# Patient Record
Sex: Female | Born: 1990 | Race: White | Hispanic: No | Marital: Married | Smoking: Never smoker
Health system: Southern US, Community
[De-identification: ages and names within clinical notes are randomized; demographics above are authoritative.]

## PROBLEM LIST (undated history)

## (undated) DIAGNOSIS — Z789 Other specified health status: Secondary | ICD-10-CM

## (undated) HISTORY — PX: OTHER SURGICAL HISTORY: SHX169

---
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
## 2010-09-15 ENCOUNTER — Inpatient Hospital Stay: Payer: Self-pay

## 2013-03-24 ENCOUNTER — Inpatient Hospital Stay: Payer: Self-pay | Admitting: Obstetrics and Gynecology

## 2013-03-24 LAB — CBC WITH DIFFERENTIAL/PLATELET
Basophil #: 0.1 10*3/uL (ref 0.0–0.1)
Eosinophil #: 0.5 10*3/uL (ref 0.0–0.7)
Eosinophil %: 2.7 %
HCT: 39.4 % (ref 35.0–47.0)
HGB: 12.9 g/dL (ref 12.0–16.0)
MCHC: 32.8 g/dL (ref 32.0–36.0)
Monocyte %: 7.7 %
Neutrophil %: 75.6 %
RBC: 4.64 10*6/uL (ref 3.80–5.20)
RDW: 15.3 % — ABNORMAL HIGH (ref 11.5–14.5)
WBC: 18.3 10*3/uL — ABNORMAL HIGH (ref 3.6–11.0)

## 2013-03-25 LAB — HEMATOCRIT: HCT: 37.4 % (ref 35.0–47.0)

## 2015-05-02 NOTE — H&P (Signed)
L&D Evaluation:  History:  HPI 24 yo G2P1001 at 7756w5d gestational age consistent with 13 week ultrasound.  Her pregnancy was otherwise uncomplicated. She presents in advanced active labor with contactions starting this past evening.  She notes positive fetal movement, no leakage of fluid, and no vaginal bleeding.   Blood type O+, RI, VZI, HBsAg neg, RPR NR, GBS POS   Patient's Medical History No Chronic Illness   Patient's Surgical History wisdom teeth extraction   Medications Pre Natal Vitamins   Allergies PCN, itching reaction   Social History none   Family History Non-Contributory   ROS:  ROS All systems were reviewed.  HEENT, CNS, GI, GU, Respiratory, CV, Renal and Musculoskeletal systems were found to be normal., unless noted in HPI   Exam:  Vital Signs stable   General moderate distress with contractions   Mental Status clear   Chest clear   Heart normal sinus rhythm   Abdomen gravid, tender with contractions   Estimated Fetal Weight Average for gestational age   Fetal Position cephalic   Back no CVAT   Edema no edema   Pelvic no external lesions, 8cm per RN   Mebranes Intact   FHT intermittent early decelerations   FHT Description normal baseline with intermittent early decelerations   Ucx no traced, but appeared regular   Skin no lesions   Impression:  Impression active labor   Plan:  Plan monitor contractions and for cervical change, antibiotics for GBBS prophylaxis   Comments Admit for labor administer ancef for GBS + Given FHR decelerations decision made to AROM.  Clear fluid returned.  Very shortly afterward delivery occured.  See delivery note for details. she would have delivered prior to receiving antibiotics regardless and given FHR quick delivery was indicated.   Electronic Signatures: Conard NovakJackson, Stephen D (MD)  (Signed 02-Apr-14 01:35)  Authored: L&D Evaluation   Last Updated: 02-Apr-14 01:35 by Conard NovakJackson, Stephen D (MD)

## 2015-12-24 NOTE — L&D Delivery Note (Addendum)
Delivery Summary for Shannon Brady  Labor Events:   Preterm labor:   Rupture date:   Rupture time:   Rupture type: Bulging bag of water  Fluid Color:   Induction:   Augmentation:   Complications:   Cervical ripening:          Delivery:   Episiotomy:   Lacerations:   Repair suture:   Repair # of packets:   Blood loss (ml): 200   Information for the patient's newborn:  Shannon Brady, Shannon Brady [[id number]][[id number]]    Delivery 07/11/2016 5:45 PM by  Vaginal, Spontaneous Delivery Sex:  female Gestational Age: 9370w5d Delivery Clinician:  Hildred LaserAnika Delyla Sandeen Living?:         APGARS  One minute Five minutes Ten minutes  Skin color: 0   1      Heart rate: 2   2      Grimace: 2   2      Muscle tone: 2   2      Breathing: 2   2      Totals: 8  9      Presentation/position: Vertex     Resuscitation:   Cord information:    Disposition of cord blood: No    Blood gases sent? No Complications: None  Placenta: Delivered: 07/11/2016 6:13 PM  Spontaneous  Intact appearance Newborn Measurements: Weight: 4 lb 12.5 oz (2170 g)  Height: 17.91"  Head circumference:    Chest circumference:    Other providers: Registered Nurse Registered Nurse Jeanne IvanMargaret A Millner Tiffany D [place]  Additional  information: Forceps:   Vacuum:   Breech:   Observed anomalies      Information for the patient's newborn:  Shannon Brady, Shannon Brady [[id number]][[id number]]    Delivery 07/11/2016 6:01 PM by  Vaginal, Spontaneous Delivery Sex:  female Gestational Age: 5570w5d Delivery Clinician:  Hildred LaserAnika Sheng Pritz Living?:         APGARS  One minute Five minutes Ten minutes  Skin color: 0   1      Heart rate: 2   2      Grimace: 2   2      Muscle tone: 1   2      Breathing: 2   2      Totals: 7  9      Presentation/position: Homero FellersFrank Breech     Resuscitation:   Cord information: 3 vessels   Disposition of cord blood:     Blood gases sent?  Complications:   Placenta: Delivered: 07/11/2016 6:13 PM  Spontaneous  Intact appearance Newborn  Measurements: Weight: 4 lb 14 oz (2210 g)  Height: 18.31"  Head circumference:    Chest circumference:    Other providers: Registered Nurse Registered Nurse Jeanne IvanMargaret A Millner Tiffany D [place]  Additional  information: Forceps:   Vacuum:   Breech:   Observed anomalies         Delivery Note   Shannon Brady, Shannon Brady [[id number]][[id number]]  At 5:45 PM a viable and healthy female was delivered via  (Presentation: vertex; Left Occiput Anterior).  APGAR: 8, 9; weight 4 lb 12.5 oz (2170 g).   Placenta status: spontaneous, intact (removed after delivery of second twin).  Cord:  with the following complications: none     Shannon Brady, Shannon Brady [[id number]][[id number]]  At 6:01 PM a viable and healthy female was delivered via  (Presentation: frank breech presentation).  APGAR: 7, 9; weight 4 lb 14 oz (2210 g).   Placenta status: spontaneous, intact (removed after  delivery of second twin).  Cord: 3 vessels with the following complications: None.  Anesthesia:  None Episiotomy:  None Lacerations:  None Suture Repair: None Est. Blood Loss (mL):  200   Mom to postpartum.   Baby A to NICU.   Baby B to NICU.  Hildred Laser 07/11/2016, 7:10 PM

## 2016-01-19 LAB — OB RESULTS CONSOLE ABO/RH: RH Type: POSITIVE

## 2016-01-19 LAB — OB RESULTS CONSOLE ANTIBODY SCREEN: Antibody Screen: NEGATIVE

## 2016-01-19 LAB — OB RESULTS CONSOLE RPR: RPR: NONREACTIVE

## 2016-01-19 LAB — HM PAP SMEAR: HM Pap smear: NEGATIVE

## 2016-01-19 LAB — OB RESULTS CONSOLE GC/CHLAMYDIA
CHLAMYDIA, DNA PROBE: NEGATIVE
GC PROBE AMP, GENITAL: NEGATIVE

## 2016-01-19 LAB — OB RESULTS CONSOLE HEPATITIS B SURFACE ANTIGEN: Hepatitis B Surface Ag: NEGATIVE

## 2016-01-19 LAB — OB RESULTS CONSOLE VARICELLA ZOSTER ANTIBODY, IGG: Varicella: IMMUNE

## 2016-01-19 LAB — OB RESULTS CONSOLE HIV ANTIBODY (ROUTINE TESTING): HIV: NONREACTIVE

## 2016-01-19 LAB — OB RESULTS CONSOLE HGB/HCT, BLOOD
HEMATOCRIT: 40 %
Hemoglobin: 13 g/dL

## 2016-01-19 LAB — OB RESULTS CONSOLE PLATELET COUNT: PLATELETS: 305 10*3/uL

## 2016-01-19 LAB — OB RESULTS CONSOLE RUBELLA ANTIBODY, IGM: RUBELLA: IMMUNE

## 2016-01-31 ENCOUNTER — Other Ambulatory Visit: Payer: Self-pay | Admitting: Advanced Practice Midwife

## 2016-01-31 DIAGNOSIS — IMO0001 Reserved for inherently not codable concepts without codable children: Secondary | ICD-10-CM

## 2016-03-18 ENCOUNTER — Other Ambulatory Visit: Payer: Self-pay

## 2016-03-18 ENCOUNTER — Ambulatory Visit
Admission: RE | Admit: 2016-03-18 | Discharge: 2016-03-18 | Disposition: A | Discharge status: Home / Self Care | Payer: Medicaid Other | Source: Ambulatory Visit | Attending: Obstetrics & Gynecology | Admitting: Obstetrics & Gynecology

## 2016-03-18 VITALS — BP 125/70 | HR 89 | Temp 98.4°F | Resp 18 | Ht 61.0 in | Wt 148.0 lb

## 2016-03-18 DIAGNOSIS — Z3A18 18 weeks gestation of pregnancy: Secondary | ICD-10-CM | POA: Insufficient documentation

## 2016-03-18 DIAGNOSIS — O30032 Twin pregnancy, monochorionic/diamniotic, second trimester: Secondary | ICD-10-CM | POA: Insufficient documentation

## 2016-03-18 DIAGNOSIS — O30042 Twin pregnancy, dichorionic/diamniotic, second trimester: Secondary | ICD-10-CM

## 2016-03-18 DIAGNOSIS — O30039 Twin pregnancy, monochorionic/diamniotic, unspecified trimester: Secondary | ICD-10-CM | POA: Insufficient documentation

## 2016-03-18 DIAGNOSIS — Z36 Encounter for antenatal screening of mother: Secondary | ICD-10-CM | POA: Insufficient documentation

## 2016-03-18 DIAGNOSIS — O30002 Twin pregnancy, unspecified number of placenta and unspecified number of amniotic sacs, second trimester: Secondary | ICD-10-CM | POA: Insufficient documentation

## 2016-03-18 DIAGNOSIS — IMO0001 Reserved for inherently not codable concepts without codable children: Secondary | ICD-10-CM

## 2016-03-18 HISTORY — DX: Other specified health status: Z78.9

## 2016-04-01 ENCOUNTER — Ambulatory Visit
Admission: RE | Admit: 2016-04-01 | Discharge: 2016-04-01 | Disposition: A | Discharge status: Home / Self Care | Payer: Medicaid Other | Source: Ambulatory Visit | Attending: Maternal & Fetal Medicine | Admitting: Maternal & Fetal Medicine

## 2016-04-01 VITALS — BP 111/68 | HR 87 | Temp 98.2°F | Resp 18 | Ht 61.0 in | Wt 153.0 lb

## 2016-04-01 DIAGNOSIS — O30032 Twin pregnancy, monochorionic/diamniotic, second trimester: Secondary | ICD-10-CM

## 2016-04-01 DIAGNOSIS — Z3A20 20 weeks gestation of pregnancy: Secondary | ICD-10-CM | POA: Diagnosis not present

## 2016-04-01 DIAGNOSIS — O30002 Twin pregnancy, unspecified number of placenta and unspecified number of amniotic sacs, second trimester: Secondary | ICD-10-CM | POA: Diagnosis present

## 2016-04-15 ENCOUNTER — Ambulatory Visit
Admission: RE | Admit: 2016-04-15 | Discharge: 2016-04-15 | Disposition: A | Discharge status: Home / Self Care | Payer: Medicaid Other | Source: Ambulatory Visit | Attending: Obstetrics and Gynecology | Admitting: Obstetrics and Gynecology

## 2016-04-15 ENCOUNTER — Other Ambulatory Visit: Payer: Self-pay

## 2016-04-15 VITALS — BP 106/64 | HR 82 | Temp 98.0°F | Resp 16 | Ht 61.0 in | Wt 158.0 lb

## 2016-04-15 DIAGNOSIS — Z36 Encounter for antenatal screening of mother: Secondary | ICD-10-CM | POA: Insufficient documentation

## 2016-04-15 DIAGNOSIS — O30032 Twin pregnancy, monochorionic/diamniotic, second trimester: Secondary | ICD-10-CM | POA: Diagnosis present

## 2016-04-15 DIAGNOSIS — O30002 Twin pregnancy, unspecified number of placenta and unspecified number of amniotic sacs, second trimester: Secondary | ICD-10-CM

## 2016-04-15 DIAGNOSIS — IMO0001 Reserved for inherently not codable concepts without codable children: Secondary | ICD-10-CM

## 2016-04-15 DIAGNOSIS — Z3A22 22 weeks gestation of pregnancy: Secondary | ICD-10-CM | POA: Diagnosis not present

## 2016-04-15 DIAGNOSIS — O358XX2 Maternal care for other (suspected) fetal abnormality and damage, fetus 2: Secondary | ICD-10-CM

## 2016-04-15 DIAGNOSIS — IMO0002 Reserved for concepts with insufficient information to code with codable children: Secondary | ICD-10-CM | POA: Insufficient documentation

## 2016-04-17 ENCOUNTER — Ambulatory Visit (INDEPENDENT_AMBULATORY_CARE_PROVIDER_SITE_OTHER): Payer: Medicaid Other | Admitting: Obstetrics and Gynecology

## 2016-04-17 VITALS — BP 103/69 | HR 99 | Wt 157.3 lb

## 2016-04-17 DIAGNOSIS — IMO0001 Reserved for inherently not codable concepts without codable children: Secondary | ICD-10-CM

## 2016-04-17 DIAGNOSIS — Z3492 Encounter for supervision of normal pregnancy, unspecified, second trimester: Secondary | ICD-10-CM

## 2016-04-17 DIAGNOSIS — O358XX2 Maternal care for other (suspected) fetal abnormality and damage, fetus 2: Secondary | ICD-10-CM

## 2016-04-17 DIAGNOSIS — R8271 Bacteriuria: Secondary | ICD-10-CM

## 2016-04-17 DIAGNOSIS — O219 Vomiting of pregnancy, unspecified: Secondary | ICD-10-CM

## 2016-04-17 DIAGNOSIS — O30032 Twin pregnancy, monochorionic/diamniotic, second trimester: Secondary | ICD-10-CM

## 2016-04-17 LAB — POCT URINALYSIS DIPSTICK
BILIRUBIN UA: NEGATIVE
GLUCOSE UA: NEGATIVE
KETONES UA: NEGATIVE
Leukocytes, UA: NEGATIVE
Nitrite, UA: NEGATIVE
Protein, UA: NEGATIVE
RBC UA: NEGATIVE
SPEC GRAV UA: 1.02
Urobilinogen, UA: 0.2
pH, UA: 6

## 2016-04-17 NOTE — Progress Notes (Signed)
NOB transfer from [facility] mono di twins- [facility] u/s q 2 weeks.  NEW OB transfer note: 25 year old white female gravida 3 para 2002, EDD 08/17/2016, EGA 22.4 weeks with mono/twins, [facility].  Past OB history: G1-SVD 6 pounds last female G2-SVD 6 lbs. 8 oz. female; rapid delivery with an adequate time to get GBS prophylaxis on board.  Prenatal risk factors: 1. Mono/diet twins; currently getting every 2 week ultrasounds [facility], most recent ultrasound notable for concordance, vertex/non vertex presentation, and appropriate growth. 2. GBS bacteriuria 3. Nausea and vomiting of pregnancy 4. Penicillin allergy  Prenatal labs: O+/antibody screen negative/RPR nonreactive/rubella immune/Varicella immune/HB negative/HIV negative/GC negative/CT negative/platelets 305,000/urine culture-GBS positive  Past Medical History  Diagnosis Date  . Medical history non-contributory    Past Surgical History  Procedure Laterality Date  . No past surgeries     Family history: Positive for breast cancer, colon cancer lymphoma, malignant melanoma, type 2 diabetes mellitus Genetic history screening negative [facility]) Patient declines genetic screening.  Social history: Nonsmoker Alcohol use denied Drug use -denied.  OBJECTIVE: BP 103/69 mmHg  Pulse 99  Wt 157 lb 4.8 oz (71.351 kg)  LMP 11/11/2015  Pleasant gravid female in no acute distress Abdomen: Fundal height 29 cm; fetal heart rate 155/142 Pelvic: Deferred Extremities: No edema  ASSESSMENT: 1. 22.4 week mono/di twin pregnancy; vertex/non-vertex, concordance on 21 week ultrasound 2. Nausea and vomiting in  pregnancy; controlled with Diclegis 3. GBS bacteriuria; penicillin allergy  PLAN: 1. Prenatal vitamins with iron daily; folic acid 1 mg daily 2. Diclegis when necessary 3. Continue with every two-week ultrasounds at [facility]; monthly  growth scans; anticipate vaginal delivery provided  presenting twin is vertex 4. Risks of preterm labor and preeclampsia in twin gestation reviewed 5. GBS prophylaxis in labor  Herold HarmsMartin A Defrancesco, MD  Note: This dictation was prepared with Dragon dictation along with smaller phrase technology. Any transcriptional errors that result from this process are unintentional.

## 2016-04-18 DIAGNOSIS — O219 Vomiting of pregnancy, unspecified: Secondary | ICD-10-CM | POA: Insufficient documentation

## 2016-04-18 DIAGNOSIS — R8271 Bacteriuria: Secondary | ICD-10-CM | POA: Insufficient documentation

## 2016-04-22 ENCOUNTER — Encounter: Payer: Self-pay | Admitting: Obstetrics and Gynecology

## 2016-04-29 ENCOUNTER — Other Ambulatory Visit: Payer: Self-pay | Admitting: Obstetrics and Gynecology

## 2016-04-29 ENCOUNTER — Ambulatory Visit
Admission: RE | Admit: 2016-04-29 | Discharge: 2016-04-29 | Disposition: A | Discharge status: Home / Self Care | Payer: Medicaid Other | Source: Ambulatory Visit | Attending: Obstetrics & Gynecology | Admitting: Obstetrics & Gynecology

## 2016-04-29 VITALS — BP 116/71 | HR 77 | Temp 98.0°F | Wt 159.0 lb

## 2016-04-29 DIAGNOSIS — O358XX2 Maternal care for other (suspected) fetal abnormality and damage, fetus 2: Secondary | ICD-10-CM

## 2016-04-29 DIAGNOSIS — O30032 Twin pregnancy, monochorionic/diamniotic, second trimester: Secondary | ICD-10-CM | POA: Insufficient documentation

## 2016-04-29 DIAGNOSIS — IMO0001 Reserved for inherently not codable concepts without codable children: Secondary | ICD-10-CM

## 2016-04-29 DIAGNOSIS — Z36 Encounter for antenatal screening of mother: Secondary | ICD-10-CM | POA: Insufficient documentation

## 2016-04-29 DIAGNOSIS — Z3A24 24 weeks gestation of pregnancy: Secondary | ICD-10-CM | POA: Diagnosis not present

## 2016-04-29 DIAGNOSIS — O30002 Twin pregnancy, unspecified number of placenta and unspecified number of amniotic sacs, second trimester: Secondary | ICD-10-CM

## 2016-05-01 ENCOUNTER — Encounter: Payer: Self-pay | Admitting: Obstetrics and Gynecology

## 2016-05-01 ENCOUNTER — Ambulatory Visit (INDEPENDENT_AMBULATORY_CARE_PROVIDER_SITE_OTHER): Payer: Medicaid Other | Admitting: Obstetrics and Gynecology

## 2016-05-01 VITALS — BP 106/63 | HR 72 | Wt 161.1 lb

## 2016-05-01 DIAGNOSIS — IMO0001 Reserved for inherently not codable concepts without codable children: Secondary | ICD-10-CM

## 2016-05-01 DIAGNOSIS — O30032 Twin pregnancy, monochorionic/diamniotic, second trimester: Secondary | ICD-10-CM

## 2016-05-01 DIAGNOSIS — R8271 Bacteriuria: Secondary | ICD-10-CM

## 2016-05-01 DIAGNOSIS — O358XX2 Maternal care for other (suspected) fetal abnormality and damage, fetus 2: Secondary | ICD-10-CM

## 2016-05-01 LAB — POCT URINALYSIS DIPSTICK
Bilirubin, UA: NEGATIVE
Blood, UA: NEGATIVE
GLUCOSE UA: NEGATIVE
Ketones, UA: NEGATIVE
LEUKOCYTES UA: NEGATIVE
Nitrite, UA: NEGATIVE
Protein, UA: NEGATIVE
UROBILINOGEN UA: NEGATIVE
pH, UA: 7.5

## 2016-05-01 NOTE — Progress Notes (Signed)
ROB: Patient doing well, notes Deberah PeltonBraxton Hicks.  Continue q 2 week scans with [facility].  Discussed timing of delivery (between 36-37 weeks), can have vaginal delivery (with set up in OR) as long as twin A vertex.  Discussed need for GBS prophylaxis, antenatal steroids if indicated. Notes nausea controlled with Diclegis (takes 1 tab nightly). RTC in 4 weeks. For 28 week labs at that time.

## 2016-05-07 ENCOUNTER — Encounter: Payer: Medicaid Other | Admitting: Obstetrics and Gynecology

## 2016-05-13 ENCOUNTER — Other Ambulatory Visit: Payer: Self-pay | Admitting: Maternal and Fetal Medicine

## 2016-05-13 ENCOUNTER — Other Ambulatory Visit: Payer: Self-pay

## 2016-05-13 ENCOUNTER — Ambulatory Visit
Admission: RE | Admit: 2016-05-13 | Discharge: 2016-05-13 | Disposition: A | Discharge status: Home / Self Care | Payer: Medicaid Other | Source: Ambulatory Visit | Attending: Maternal and Fetal Medicine | Admitting: Maternal and Fetal Medicine

## 2016-05-13 DIAGNOSIS — O30049 Twin pregnancy, dichorionic/diamniotic, unspecified trimester: Secondary | ICD-10-CM

## 2016-05-13 DIAGNOSIS — O30002 Twin pregnancy, unspecified number of placenta and unspecified number of amniotic sacs, second trimester: Secondary | ICD-10-CM | POA: Diagnosis not present

## 2016-05-13 DIAGNOSIS — O30032 Twin pregnancy, monochorionic/diamniotic, second trimester: Secondary | ICD-10-CM

## 2016-05-13 DIAGNOSIS — Z3A26 26 weeks gestation of pregnancy: Secondary | ICD-10-CM | POA: Diagnosis not present

## 2016-05-13 MED ORDER — FOLIC ACID 1 MG PO TABS: 1.0000 mg | ORAL_TABLET | Freq: Every day | ORAL | Status: DC

## 2016-05-16 ENCOUNTER — Encounter: Payer: Self-pay | Admitting: Certified Nurse Midwife

## 2016-05-16 ENCOUNTER — Encounter: Payer: Self-pay | Admitting: Obstetrics & Gynecology

## 2016-05-16 ENCOUNTER — Encounter: Payer: Self-pay | Admitting: Advanced Practice Midwife

## 2016-05-16 ENCOUNTER — Encounter: Payer: Self-pay | Admitting: Obstetrics and Gynecology

## 2016-05-23 ENCOUNTER — Other Ambulatory Visit: Payer: Self-pay

## 2016-05-23 DIAGNOSIS — O30033 Twin pregnancy, monochorionic/diamniotic, third trimester: Secondary | ICD-10-CM

## 2016-05-27 ENCOUNTER — Ambulatory Visit
Admission: RE | Admit: 2016-05-27 | Discharge: 2016-05-27 | Disposition: A | Discharge status: Home / Self Care | Payer: Medicaid Other | Source: Ambulatory Visit | Attending: Obstetrics and Gynecology | Admitting: Obstetrics and Gynecology

## 2016-05-27 VITALS — BP 119/78 | HR 83 | Temp 98.0°F | Wt 167.0 lb

## 2016-05-27 DIAGNOSIS — O30033 Twin pregnancy, monochorionic/diamniotic, third trimester: Secondary | ICD-10-CM | POA: Diagnosis not present

## 2016-05-27 DIAGNOSIS — O358XX1 Maternal care for other (suspected) fetal abnormality and damage, fetus 1: Secondary | ICD-10-CM

## 2016-05-27 DIAGNOSIS — Z3A28 28 weeks gestation of pregnancy: Secondary | ICD-10-CM | POA: Insufficient documentation

## 2016-05-27 DIAGNOSIS — IMO0001 Reserved for inherently not codable concepts without codable children: Secondary | ICD-10-CM

## 2016-05-27 DIAGNOSIS — O43023 Fetus-to-fetus placental transfusion syndrome, third trimester: Secondary | ICD-10-CM | POA: Diagnosis present

## 2016-05-27 DIAGNOSIS — Z36 Encounter for antenatal screening of mother: Secondary | ICD-10-CM | POA: Diagnosis present

## 2016-05-29 ENCOUNTER — Ambulatory Visit (INDEPENDENT_AMBULATORY_CARE_PROVIDER_SITE_OTHER): Payer: Medicaid Other | Admitting: Obstetrics and Gynecology

## 2016-05-29 ENCOUNTER — Other Ambulatory Visit: Payer: Medicaid Other

## 2016-05-29 ENCOUNTER — Encounter: Payer: Self-pay | Admitting: Obstetrics and Gynecology

## 2016-05-29 VITALS — BP 101/64 | HR 80 | Wt 170.3 lb

## 2016-05-29 DIAGNOSIS — Z23 Encounter for immunization: Secondary | ICD-10-CM | POA: Diagnosis not present

## 2016-05-29 DIAGNOSIS — O30033 Twin pregnancy, monochorionic/diamniotic, third trimester: Secondary | ICD-10-CM | POA: Diagnosis not present

## 2016-05-29 DIAGNOSIS — O0990 Supervision of high risk pregnancy, unspecified, unspecified trimester: Secondary | ICD-10-CM | POA: Insufficient documentation

## 2016-05-29 DIAGNOSIS — O0993 Supervision of high risk pregnancy, unspecified, third trimester: Secondary | ICD-10-CM

## 2016-05-29 DIAGNOSIS — O30032 Twin pregnancy, monochorionic/diamniotic, second trimester: Secondary | ICD-10-CM

## 2016-05-29 LAB — POCT URINALYSIS DIPSTICK
BILIRUBIN UA: NEGATIVE
Blood, UA: NEGATIVE
GLUCOSE UA: NEGATIVE
Ketones, UA: NEGATIVE
Leukocytes, UA: NEGATIVE
NITRITE UA: NEGATIVE
Protein, UA: NEGATIVE
UROBILINOGEN UA: NEGATIVE
pH, UA: 7.5

## 2016-05-29 MED ORDER — TETANUS-DIPHTH-ACELL PERTUSSIS 5-2.5-18.5 LF-MCG/0.5 IM SUSP
0.5000 mL | Freq: Once | INTRAMUSCULAR | Status: AC
  Administered 2016-05-29: 0.5 mL via INTRAMUSCULAR

## 2016-05-29 NOTE — Progress Notes (Signed)
ROB: Patient denies complaints. Still noting Shannon PeltonBraxton Hicks.  Continue q 2 week scans with [facility] for mono-di twins. Last scan 05/27/16, normal growth, no TTTS, Baby B now Breech.  For 28 week labs today, Tdap given, discussed cord blood banking, blood consent signed.  Desires to breastfeed.  Desires BTL.  Other reversible forms of contraception were discussed with patient; she declines all other modalities. Risks of procedure discussed with patient including but not limited to: risk of regret, permanence of method, bleeding, infection, injury to surrounding organs and need for additional procedures.  Failure risk of 1-2 % with increased risk of ectopic gestation if pregnancy occurs was also discussed with patient.  Patient verbalized understanding of these risks and wants to proceed with sterilization.  Medicaid forms signed.  RTC in 2 weeks.

## 2016-05-30 LAB — CBC
Hematocrit: 39.7 % (ref 34.0–46.6)
Hemoglobin: 13.2 g/dL (ref 11.1–15.9)
MCH: 29.9 pg (ref 26.6–33.0)
MCHC: 33.2 g/dL (ref 31.5–35.7)
MCV: 90 fL (ref 79–97)
PLATELETS: 255 10*3/uL (ref 150–379)
RBC: 4.41 x10E6/uL (ref 3.77–5.28)
RDW: 13.5 % (ref 12.3–15.4)
WBC: 11.8 10*3/uL — AB (ref 3.4–10.8)

## 2016-05-30 LAB — GLUCOSE, 1 HOUR GESTATIONAL: GESTATIONAL DIABETES SCREEN: 93 mg/dL (ref 65–139)

## 2016-06-10 ENCOUNTER — Ambulatory Visit: Payer: Medicaid Other

## 2016-06-13 ENCOUNTER — Ambulatory Visit (INDEPENDENT_AMBULATORY_CARE_PROVIDER_SITE_OTHER): Payer: Medicaid Other

## 2016-06-13 ENCOUNTER — Ambulatory Visit (INDEPENDENT_AMBULATORY_CARE_PROVIDER_SITE_OTHER): Payer: Medicaid Other | Admitting: Obstetrics and Gynecology

## 2016-06-13 VITALS — BP 115/81 | HR 71 | Wt 174.0 lb

## 2016-06-13 DIAGNOSIS — O30033 Twin pregnancy, monochorionic/diamniotic, third trimester: Secondary | ICD-10-CM

## 2016-06-13 DIAGNOSIS — Z3493 Encounter for supervision of normal pregnancy, unspecified, third trimester: Secondary | ICD-10-CM

## 2016-06-13 DIAGNOSIS — O0993 Supervision of high risk pregnancy, unspecified, third trimester: Secondary | ICD-10-CM

## 2016-06-13 LAB — POCT URINALYSIS DIPSTICK
Bilirubin, UA: NEGATIVE
GLUCOSE UA: NEGATIVE
KETONES UA: NEGATIVE
Leukocytes, UA: NEGATIVE
Nitrite, UA: NEGATIVE
PROTEIN UA: NEGATIVE
RBC UA: NEGATIVE
SPEC GRAV UA: 1.01
UROBILINOGEN UA: NEGATIVE
pH, UA: 7

## 2016-06-13 NOTE — Progress Notes (Signed)
ROB:  Pt states increased swelling while up and out in the heat.  No other complaints at this time.  Continues to note braxton hicks. Denies vaginal or bloody discharge, HA, and visual changes.

## 2016-06-13 NOTE — Progress Notes (Signed)
ROB: Doing well, but complains of occasional feet swelling after standing for long periods of time. Advised  Growth scan performed today, 1.2% growth discordance, no TTTS, Baby A remains vertex. RTC in 2 weeks, continue q 2 week growth scans. Patient desires to continue growth scans here instead of [facility] for convenience.  Will schedule. Will also need to begin weekly NSTs at 32 weeks.

## 2016-06-27 ENCOUNTER — Encounter: Payer: Self-pay | Admitting: Obstetrics and Gynecology

## 2016-06-27 ENCOUNTER — Ambulatory Visit (INDEPENDENT_AMBULATORY_CARE_PROVIDER_SITE_OTHER): Payer: Medicaid Other

## 2016-06-27 ENCOUNTER — Ambulatory Visit (INDEPENDENT_AMBULATORY_CARE_PROVIDER_SITE_OTHER): Payer: Medicaid Other | Admitting: Obstetrics and Gynecology

## 2016-06-27 VITALS — BP 130/90 | HR 98 | Wt 173.4 lb

## 2016-06-27 DIAGNOSIS — O30033 Twin pregnancy, monochorionic/diamniotic, third trimester: Secondary | ICD-10-CM | POA: Diagnosis not present

## 2016-06-27 DIAGNOSIS — Z369 Encounter for antenatal screening, unspecified: Secondary | ICD-10-CM

## 2016-06-27 DIAGNOSIS — Z36 Encounter for antenatal screening of mother: Secondary | ICD-10-CM

## 2016-06-27 DIAGNOSIS — Z1389 Encounter for screening for other disorder: Secondary | ICD-10-CM

## 2016-06-27 DIAGNOSIS — O0993 Supervision of high risk pregnancy, unspecified, third trimester: Secondary | ICD-10-CM

## 2016-06-27 DIAGNOSIS — Z3493 Encounter for supervision of normal pregnancy, unspecified, third trimester: Secondary | ICD-10-CM

## 2016-06-27 LAB — POCT URINALYSIS DIPSTICK
Bilirubin, UA: NEGATIVE
Blood, UA: NEGATIVE
GLUCOSE UA: NEGATIVE
Ketones, UA: NEGATIVE
LEUKOCYTES UA: NEGATIVE
NITRITE UA: NEGATIVE
Protein, UA: NEGATIVE
Spec Grav, UA: 1.01
UROBILINOGEN UA: NEGATIVE
pH, UA: 6

## 2016-06-27 NOTE — Progress Notes (Signed)
B/P 130/90 x2 with manual cuff.

## 2016-06-30 NOTE — Progress Notes (Signed)
ROB: Patient denies complaints.  S/p normal growth scan, x 2 no evidence of TTTS.  Continue q 2 week scans.  To plan next visit for scheduled IOL at 36-37 weeks due to mono-di twins. To perform 36 week labs next visit. BPs slighlty elevated for patient, but still wnl, no evidence of edema or proteinuria.  Will continue to monitor.  To begin weekly NSTs. RTC in 2 weeks.

## 2016-07-04 ENCOUNTER — Ambulatory Visit (INDEPENDENT_AMBULATORY_CARE_PROVIDER_SITE_OTHER): Payer: Medicaid Other | Admitting: Obstetrics and Gynecology

## 2016-07-04 VITALS — BP 124/80 | HR 75 | Wt 179.4 lb

## 2016-07-04 DIAGNOSIS — Z36 Encounter for antenatal screening of mother: Secondary | ICD-10-CM

## 2016-07-04 DIAGNOSIS — O30033 Twin pregnancy, monochorionic/diamniotic, third trimester: Secondary | ICD-10-CM

## 2016-07-04 DIAGNOSIS — Z369 Encounter for antenatal screening, unspecified: Secondary | ICD-10-CM

## 2016-07-04 DIAGNOSIS — IMO0001 Reserved for inherently not codable concepts without codable children: Secondary | ICD-10-CM

## 2016-07-04 DIAGNOSIS — Z1389 Encounter for screening for other disorder: Secondary | ICD-10-CM

## 2016-07-04 DIAGNOSIS — O358XX1 Maternal care for other (suspected) fetal abnormality and damage, fetus 1: Secondary | ICD-10-CM

## 2016-07-04 LAB — POCT URINALYSIS DIPSTICK
Bilirubin, UA: NEGATIVE
Blood, UA: NEGATIVE
GLUCOSE UA: NEGATIVE
KETONES UA: NEGATIVE
LEUKOCYTES UA: NEGATIVE
Nitrite, UA: NEGATIVE
PROTEIN UA: NEGATIVE
SPEC GRAV UA: 1.01
UROBILINOGEN UA: NEGATIVE
pH, UA: 7.5

## 2016-07-04 NOTE — Progress Notes (Signed)
NONSTRESS TEST INTERPRETATION  INDICATIONS:  TTTS; slightly elevated B/P  FHR baseline: Baby A: 130    Baby B:120-130 RESULTS: reactive/reactive COMMENTS: pt states she does not feel any contractions, although she states she does have braxton hicks at times.   PLAN: 1. Continue fetal kick counts twice a day. 2. Continue antepartum testing as scheduled-weekly   Fenton Mallingebbie Aalijah Mims, LPN

## 2016-07-06 ENCOUNTER — Observation Stay
Admission: RE | Admit: 2016-07-06 | Discharge: 2016-07-06 | Disposition: A | Discharge status: Home / Self Care | Payer: Medicaid Other | Attending: Obstetrics and Gynecology | Admitting: Obstetrics and Gynecology

## 2016-07-06 ENCOUNTER — Encounter: Payer: Self-pay | Admitting: *Deleted

## 2016-07-06 DIAGNOSIS — O30033 Twin pregnancy, monochorionic/diamniotic, third trimester: Secondary | ICD-10-CM | POA: Insufficient documentation

## 2016-07-06 DIAGNOSIS — O26853 Spotting complicating pregnancy, third trimester: Principal | ICD-10-CM | POA: Insufficient documentation

## 2016-07-06 DIAGNOSIS — Z3A34 34 weeks gestation of pregnancy: Secondary | ICD-10-CM | POA: Diagnosis not present

## 2016-07-06 DIAGNOSIS — O358XX1 Maternal care for other (suspected) fetal abnormality and damage, fetus 1: Secondary | ICD-10-CM

## 2016-07-06 DIAGNOSIS — IMO0001 Reserved for inherently not codable concepts without codable children: Secondary | ICD-10-CM

## 2016-07-06 LAB — CBC
HCT: 40.5 % (ref 35.0–47.0)
Hemoglobin: 14.2 g/dL (ref 12.0–16.0)
MCH: 31.9 pg (ref 26.0–34.0)
MCHC: 35 g/dL (ref 32.0–36.0)
MCV: 91.3 fL (ref 80.0–100.0)
PLATELETS: 179 10*3/uL (ref 150–440)
RBC: 4.43 MIL/uL (ref 3.80–5.20)
RDW: 13.1 % (ref 11.5–14.5)
WBC: 11.7 10*3/uL — ABNORMAL HIGH (ref 3.6–11.0)

## 2016-07-06 LAB — COMPREHENSIVE METABOLIC PANEL
ALK PHOS: 201 U/L — AB (ref 38–126)
ALT: 12 U/L — AB (ref 14–54)
AST: 24 U/L (ref 15–41)
Albumin: 3 g/dL — ABNORMAL LOW (ref 3.5–5.0)
Anion gap: 4 — ABNORMAL LOW (ref 5–15)
BUN: 8 mg/dL (ref 6–20)
CALCIUM: 9 mg/dL (ref 8.9–10.3)
CO2: 25 mmol/L (ref 22–32)
CREATININE: 0.65 mg/dL (ref 0.44–1.00)
Chloride: 106 mmol/L (ref 101–111)
GFR calc Af Amer: >60 mL/min (ref >60)
GLUCOSE: 78 mg/dL (ref 65–99)
Potassium: 4.3 mmol/L (ref 3.5–5.1)
SODIUM: 135 mmol/L (ref 135–145)
Total Bilirubin: 0.7 mg/dL (ref 0.3–1.2)
Total Protein: 5.9 g/dL — ABNORMAL LOW (ref 6.5–8.1)

## 2016-07-06 LAB — URINALYSIS COMPLETE WITH MICROSCOPIC (ARMC ONLY)
BACTERIA UA: NONE SEEN
Bilirubin Urine: NEGATIVE
GLUCOSE, UA: NEGATIVE mg/dL
HGB URINE DIPSTICK: NEGATIVE
Ketones, ur: NEGATIVE mg/dL
LEUKOCYTES UA: NEGATIVE
Nitrite: NEGATIVE
Protein, ur: NEGATIVE mg/dL
Specific Gravity, Urine: 1.008 (ref 1.005–1.030)
pH: 7 (ref 5.0–8.0)

## 2016-07-06 LAB — PROTEIN / CREATININE RATIO, URINE: CREATININE, URINE: 62 mg/dL

## 2016-07-06 NOTE — OB Triage Note (Signed)
Bloody mucous discharge noted X 1 this am. "Some bright red some brownish." Has been to bathroom 2 x since without mucous. Last intercourse was Thursday, 7/13. Reports cramping "like normal" this morning. Elaina HoopsElks, Rida Loudin S

## 2016-07-06 NOTE — Final Progress Note (Signed)
L&D OB Triage Note  SUBJECTIVE Shannon Brady is a 25 y.o. 383P2002 female at 2717w0d, EDD Estimated Date of Delivery: 08/17/16 who presented to triage with complaints of vaginal spotting. Pt has concordant Mono/Di twins. No reported Pre Eclampsia symptoms. Good FM.    OBJECTIVE Nursing Evaluation: BP 119/72 mmHg  Pulse 66  Temp(Src) 98.3 F (36.8 C) (Oral)  Resp 16  Ht 5\' 1"  (1.549 m)  Wt 179 lb 9.6 oz (81.466 kg)  BMI 33.95 kg/m2  LMP 11/11/2015 no significant findings for PML or Pre Eclampsia. No active bleeding.   NST was performed and has been reviewed by me.  NST INTERPRETATION: Indications: vaginal bleeding; Mono/Di twins  Mode: External Baseline Rate (A): 130 bpm Variability: Moderate Accelerations: 15 x 15 Decelerations: None     Contraction Frequency (min): 2 ctx noted with ui  ASSESSMENT Impression:  1. Pregnancy:  G3P2002 at 2717w0d , EDD Estimated Date of Delivery: 08/17/16 2.  Reactive/reactive NST 3. Normal CBC, CMP, UA. No proteinuria  PLAN 1. Reassurance given 2. Discharge home with bleeding/labor precautions, Pre Eclampsia precautions. 3. FKC BID 4. Return in 3 days to office for NST   Herold HarmsMartin A Chalonda Schlatter, MD

## 2016-07-08 ENCOUNTER — Ambulatory Visit (INDEPENDENT_AMBULATORY_CARE_PROVIDER_SITE_OTHER): Payer: Medicaid Other | Admitting: Obstetrics and Gynecology

## 2016-07-08 VITALS — BP 118/80 | HR 81 | Wt 179.0 lb

## 2016-07-08 DIAGNOSIS — O30033 Twin pregnancy, monochorionic/diamniotic, third trimester: Secondary | ICD-10-CM

## 2016-07-08 DIAGNOSIS — Z1389 Encounter for screening for other disorder: Secondary | ICD-10-CM

## 2016-07-08 DIAGNOSIS — O358XX1 Maternal care for other (suspected) fetal abnormality and damage, fetus 1: Secondary | ICD-10-CM

## 2016-07-08 DIAGNOSIS — Z36 Encounter for antenatal screening of mother: Secondary | ICD-10-CM

## 2016-07-08 DIAGNOSIS — IMO0001 Reserved for inherently not codable concepts without codable children: Secondary | ICD-10-CM

## 2016-07-08 DIAGNOSIS — Z369 Encounter for antenatal screening, unspecified: Secondary | ICD-10-CM

## 2016-07-08 LAB — POCT URINALYSIS DIPSTICK
Bilirubin, UA: NEGATIVE
Blood, UA: NEGATIVE
Glucose, UA: NEGATIVE
KETONES UA: NEGATIVE
LEUKOCYTES UA: NEGATIVE
Nitrite, UA: NEGATIVE
PH UA: 6
PROTEIN UA: NEGATIVE
SPEC GRAV UA: 1.01
Urobilinogen, UA: 0.2

## 2016-07-08 NOTE — Progress Notes (Signed)
NONSTRESS TEST INTERPRETATION  INDICATIONS: TTTS; slightly elevated B/P  FHR baseline: Baby A: 130     Baby B:  120-130 RESULTS: reactive COMMENTS:    PLAN: 1. Continue fetal kick counts twice a day. 2. Continue antepartum testing as scheduled-Biweekly   Fenton Mallingebbie Shalen Petrak, LPN

## 2016-07-11 ENCOUNTER — Encounter: Payer: Medicaid Other | Admitting: Obstetrics and Gynecology

## 2016-07-11 ENCOUNTER — Other Ambulatory Visit: Payer: Medicaid Other

## 2016-07-11 ENCOUNTER — Ambulatory Visit (INDEPENDENT_AMBULATORY_CARE_PROVIDER_SITE_OTHER): Payer: Medicaid Other

## 2016-07-11 ENCOUNTER — Inpatient Hospital Stay
Admission: RE | Admit: 2016-07-11 | Discharge: 2016-07-13 | DRG: 775 | Disposition: A | Discharge status: Home / Self Care | Payer: Medicaid Other | Attending: Obstetrics and Gynecology | Admitting: Obstetrics and Gynecology

## 2016-07-11 ENCOUNTER — Encounter: Payer: Self-pay | Admitting: *Deleted

## 2016-07-11 ENCOUNTER — Ambulatory Visit (INDEPENDENT_AMBULATORY_CARE_PROVIDER_SITE_OTHER): Payer: Medicaid Other | Admitting: Obstetrics and Gynecology

## 2016-07-11 ENCOUNTER — Encounter: Payer: Self-pay | Admitting: Obstetrics and Gynecology

## 2016-07-11 VITALS — BP 129/79 | HR 89 | Wt 180.9 lb

## 2016-07-11 DIAGNOSIS — O321XX2 Maternal care for breech presentation, fetus 2: Secondary | ICD-10-CM | POA: Diagnosis present

## 2016-07-11 DIAGNOSIS — O99824 Streptococcus B carrier state complicating childbirth: Secondary | ICD-10-CM | POA: Diagnosis present

## 2016-07-11 DIAGNOSIS — Z3A34 34 weeks gestation of pregnancy: Secondary | ICD-10-CM

## 2016-07-11 DIAGNOSIS — R8271 Bacteriuria: Secondary | ICD-10-CM | POA: Diagnosis not present

## 2016-07-11 DIAGNOSIS — O0993 Supervision of high risk pregnancy, unspecified, third trimester: Secondary | ICD-10-CM

## 2016-07-11 DIAGNOSIS — O30033 Twin pregnancy, monochorionic/diamniotic, third trimester: Secondary | ICD-10-CM | POA: Diagnosis present

## 2016-07-11 DIAGNOSIS — Z3493 Encounter for supervision of normal pregnancy, unspecified, third trimester: Secondary | ICD-10-CM | POA: Diagnosis not present

## 2016-07-11 LAB — CBC
HEMATOCRIT: 42.6 % (ref 35.0–47.0)
HEMOGLOBIN: 14.6 g/dL (ref 12.0–16.0)
MCH: 31.5 pg (ref 26.0–34.0)
MCHC: 34.4 g/dL (ref 32.0–36.0)
MCV: 91.5 fL (ref 80.0–100.0)
Platelets: 196 10*3/uL (ref 150–440)
RBC: 4.66 MIL/uL (ref 3.80–5.20)
RDW: 13.3 % (ref 11.5–14.5)
WBC: 13.5 10*3/uL — ABNORMAL HIGH (ref 3.6–11.0)

## 2016-07-11 LAB — POCT URINALYSIS DIPSTICK
Blood, UA: NEGATIVE
GLUCOSE UA: NEGATIVE
KETONES UA: NEGATIVE
Leukocytes, UA: NEGATIVE
Nitrite, UA: NEGATIVE
SPEC GRAV UA: 1.02
Urobilinogen, UA: 0.2
pH, UA: 8.5

## 2016-07-11 LAB — TYPE AND SCREEN
ABO/RH(D): O POS
Antibody Screen: NEGATIVE

## 2016-07-11 MED ORDER — CLINDAMYCIN PHOSPHATE 900 MG/50ML IV SOLN
900.0000 mg | Freq: Once | INTRAVENOUS | Status: AC
  Administered 2016-07-11: 900 mg via INTRAVENOUS
  Filled 2016-07-11: qty 50

## 2016-07-11 MED ORDER — OXYTOCIN 40 UNITS IN LACTATED RINGERS INFUSION - SIMPLE MED: 2.5000 [IU]/h | INTRAVENOUS | Status: DC

## 2016-07-11 MED ORDER — LIDOCAINE HCL (PF) 1 % IJ SOLN
INTRAMUSCULAR | Status: AC
  Filled 2016-07-11: qty 30

## 2016-07-11 MED ORDER — AMMONIA AROMATIC IN INHA
RESPIRATORY_TRACT | Status: AC
  Filled 2016-07-11: qty 10

## 2016-07-11 MED ORDER — LACTATED RINGERS IV SOLN
INTRAVENOUS | Status: DC
  Administered 2016-07-12: 02:00:00 via INTRAVENOUS

## 2016-07-11 MED ORDER — OXYTOCIN 40 UNITS IN LACTATED RINGERS INFUSION - SIMPLE MED
INTRAVENOUS | Status: AC
  Administered 2016-07-11: 500 mL via INTRAVENOUS
  Filled 2016-07-11: qty 1000

## 2016-07-11 MED ORDER — FAMOTIDINE 20 MG PO TABS: 40.0000 mg | ORAL_TABLET | Freq: Once | ORAL | Status: DC

## 2016-07-11 MED ORDER — ONDANSETRON HCL 4 MG/2ML IJ SOLN: 4.0000 mg | Freq: Four times a day (QID) | INTRAMUSCULAR | Status: DC | PRN

## 2016-07-11 MED ORDER — TETANUS-DIPHTH-ACELL PERTUSSIS 5-2.5-18.5 LF-MCG/0.5 IM SUSP: 0.5000 mL | Freq: Once | INTRAMUSCULAR | Status: DC

## 2016-07-11 MED ORDER — LACTATED RINGERS IV SOLN
INTRAVENOUS | Status: DC
  Administered 2016-07-11: 125 mL/h via INTRAVENOUS

## 2016-07-11 MED ORDER — COCONUT OIL OIL: 1.0000 "application " | TOPICAL_OIL | Status: DC | PRN

## 2016-07-11 MED ORDER — OXYTOCIN BOLUS FROM INFUSION
500.0000 mL | INTRAVENOUS | Status: DC
  Administered 2016-07-11: 500 mL via INTRAVENOUS

## 2016-07-11 MED ORDER — OXYTOCIN 10 UNIT/ML IJ SOLN
INTRAMUSCULAR | Status: AC
  Filled 2016-07-11: qty 2

## 2016-07-11 MED ORDER — BETAMETHASONE SOD PHOS & ACET 6 (3-3) MG/ML IJ SUSP
12.0000 mg | Freq: Once | INTRAMUSCULAR | Status: AC
  Administered 2016-07-11: 12 mg via INTRAMUSCULAR

## 2016-07-11 MED ORDER — ONDANSETRON HCL 4 MG/2ML IJ SOLN: 4.0000 mg | INTRAMUSCULAR | Status: DC | PRN

## 2016-07-11 MED ORDER — DIBUCAINE 1 % RE OINT: 1.0000 "application " | TOPICAL_OINTMENT | RECTAL | Status: DC | PRN

## 2016-07-11 MED ORDER — ACETAMINOPHEN 325 MG PO TABS: 650.0000 mg | ORAL_TABLET | ORAL | Status: DC | PRN

## 2016-07-11 MED ORDER — LIDOCAINE HCL (PF) 1 % IJ SOLN: 30.0000 mL | INTRAMUSCULAR | Status: DC | PRN

## 2016-07-11 MED ORDER — PRENATAL MULTIVITAMIN CH
1.0000 | ORAL_TABLET | Freq: Every day | ORAL | Status: DC
  Filled 2016-07-11 (×2): qty 1

## 2016-07-11 MED ORDER — BUTORPHANOL TARTRATE 1 MG/ML IJ SOLN: 1.0000 mg | INTRAMUSCULAR | Status: DC | PRN

## 2016-07-11 MED ORDER — ONDANSETRON HCL 4 MG PO TABS: 4.0000 mg | ORAL_TABLET | ORAL | Status: DC | PRN

## 2016-07-11 MED ORDER — TERBUTALINE SULFATE 1 MG/ML IJ SOLN: 0.2500 mg | Freq: Once | INTRAMUSCULAR | Status: DC | PRN

## 2016-07-11 MED ORDER — OXYCODONE-ACETAMINOPHEN 5-325 MG PO TABS: 1.0000 | ORAL_TABLET | ORAL | Status: DC | PRN

## 2016-07-11 MED ORDER — SOD CITRATE-CITRIC ACID 500-334 MG/5ML PO SOLN: 30.0000 mL | ORAL | Status: DC | PRN

## 2016-07-11 MED ORDER — IBUPROFEN 600 MG PO TABS
600.0000 mg | ORAL_TABLET | Freq: Four times a day (QID) | ORAL | Status: DC
  Filled 2016-07-11 (×2): qty 1

## 2016-07-11 MED ORDER — BENZOCAINE-MENTHOL 20-0.5 % EX AERO: 1.0000 "application " | INHALATION_SPRAY | CUTANEOUS | Status: DC | PRN

## 2016-07-11 MED ORDER — LACTATED RINGERS IV SOLN: 500.0000 mL | INTRAVENOUS | Status: DC | PRN

## 2016-07-11 MED ORDER — SENNOSIDES-DOCUSATE SODIUM 8.6-50 MG PO TABS
2.0000 | ORAL_TABLET | ORAL | Status: DC
  Administered 2016-07-13: 2 via ORAL
  Filled 2016-07-11: qty 2

## 2016-07-11 MED ORDER — OXYTOCIN 40 UNITS IN LACTATED RINGERS INFUSION - SIMPLE MED: 1.0000 m[IU]/min | INTRAVENOUS | Status: DC

## 2016-07-11 MED ORDER — SIMETHICONE 80 MG PO CHEW: 80.0000 mg | CHEWABLE_TABLET | ORAL | Status: DC | PRN

## 2016-07-11 MED ORDER — MISOPROSTOL 200 MCG PO TABS
ORAL_TABLET | ORAL | Status: AC
  Filled 2016-07-11: qty 4

## 2016-07-11 MED ORDER — DIPHENHYDRAMINE HCL 25 MG PO CAPS: 25.0000 mg | ORAL_CAPSULE | Freq: Four times a day (QID) | ORAL | Status: DC | PRN

## 2016-07-11 MED ORDER — WITCH HAZEL-GLYCERIN EX PADS: 1.0000 "application " | MEDICATED_PAD | CUTANEOUS | Status: DC | PRN

## 2016-07-11 MED ORDER — METOCLOPRAMIDE HCL 10 MG PO TABS: 10.0000 mg | ORAL_TABLET | Freq: Once | ORAL | Status: DC

## 2016-07-11 MED ORDER — OXYCODONE-ACETAMINOPHEN 5-325 MG PO TABS: 2.0000 | ORAL_TABLET | ORAL | Status: DC | PRN

## 2016-07-11 MED ORDER — ZOLPIDEM TARTRATE 5 MG PO TABS: 5.0000 mg | ORAL_TABLET | Freq: Every evening | ORAL | Status: DC | PRN

## 2016-07-11 NOTE — Progress Notes (Signed)
ROB: Patient c/o contractions x 4 days, progressively getting closer together and loss of mucus plug. Was seen in triage on Saturday with contractions, noted to be 2 cm.  Cervix today 6/80/-2.  Given initial dose of betamethasone 12.5 mg in office today. Will send to L&D for delivery.  Will perform GBS sensitivities then, is GBS+ from urine.  Ultrasound for growth today, normal growth and AFI x 2, Baby A vertex, Baby B breech.

## 2016-07-11 NOTE — H&P (Addendum)
Obstetric History and Physical  Shannon Brady is a 25 y.o. [phone] with twin mono-di IUP at [redacted]w[redacted]d presenting for active preterm labor.  Sent from clinic today with cervical dilation of 6 cm. Patient states she has been having  irregular,  x 4 days, progressively getting closer together. No vaginal bleeding, intact membranes, with active fetal movement.    Prenatal Course Source of Care: [facility] with onset of care at 22 weeks (transfer from [facility]) Pregnancy complications or risks: Patient Active Problem List   Diagnosis Date Noted  . Preterm labor 07/11/2016  . Indication for care in labor and delivery, antepartum 07/06/2016  . Supervision of high-risk pregnancy 05/29/2016  . Nausea and vomiting during pregnancy 04/18/2016  . GBS bacteriuria 04/18/2016  . Twin B pelviectasis  04/15/2016  . Twin gestation in second trimester 03/18/2016  . Monochorionic diamniotic twin gestation 03/18/2016   She plans to breastfeed She desires bilateral tubal ligation for postpartum contraception.   Prenatal labs and studies: ABO, Rh: --/--/O POS (07/20 1501) Antibody: NEG (07/20 1501) Rubella: Immune (01/27 0000) RPR: Nonreactive (01/27 0000)  HBsAg: Negative (01/27 0000)  HIV: Non-reactive (01/27 0000)  GBS: Positive (GBS bacteriuria)  1 hr Glucola  normal Genetic screening normal Anatomy US normal  Prenatal Transfer Tool  Maternal Diabetes: No Genetic Screening: Normal Maternal Ultrasounds/Referrals: Normal Fetal Ultrasounds or other Referrals:  Referred to Materal Fetal Medicine  Maternal Substance Abuse:  No Significant Maternal Medications:  None Significant Maternal Lab Results: Lab values include: Group B Strep positive  Past Medical History  Diagnosis Date  . Medical history non-contributory     Past Surgical History  Procedure Laterality Date  . No past surgeries      OB History  Gravida Para Term Preterm AB SAB TAB Ectopic Multiple Living  # Outcome Date GA Lbr Len/2nd Weight Sex Delivery Anes PTL Lv  3 Current           2 Term 03/24/13    F Vag-Spont   Y  1 Term 09/16/10    F Vag-Spont   Y      Social History   Social History  . Marital Status: Married    Spouse Name: N/A  . Number of Children: N/A  . Years of Education: N/A   Social History Main Topics  . Smoking status: Never Smoker   . Smokeless tobacco: Never Used  . Alcohol Use: No  . Drug Use: No  . Sexual Activity: Yes    Birth Control/ Protection: None     Comment: Pregnant    Other Topics Concern  . Not on file   Social History Narrative    No family history on file.  Prescriptions prior to admission  Medication Sig Dispense Refill Last Dose  . DICLEGIS 10-10 MG TBEC   0 07/10/2016  . folic acid (FOLVITE) 1 MG tablet Take 1 tablet (1 mg total) by mouth daily. 100 tablet 3 07/10/2016  . Prenatal Vit-Fe Fumarate-FA (PRENATAL MULTIVITAMIN) TABS tablet Take 1 tablet by mouth daily at 12 noon.   07/10/2016    Allergies  Allergen Reactions  . Penicillins Itching    Review of Systems: Negative except for what is mentioned in HPI.  Physical Exam: BP 126/78 mmHg  Pulse 71  Temp(Src) 98.2 F (36.8 C) (Axillary)  Resp 18  Ht  (1.549 m)  Wt 180 lb (81.647 kg)  BMI 34.03 kg/m2  LMP 11/11/2015 CONSTITUTIONAL: Well-developed, well-nourished female in no acute distress.  HENT:  Normocephalic, atraumatic, External right and left ear normal. Oropharynx is clear and moist EYES: Conjunctivae and EOM are normal. Pupils are equal, round, and reactive to light. No scleral icterus.  NECK: Normal range of motion, supple, no masses SKIN: Skin is warm and dry. No rash noted. Not diaphoretic. No erythema. No pallor. NEUROLOGIC: Alert and oriented to person, place, and time. Normal reflexes, muscle tone coordination. No cranial nerve deficit noted. PSYCHIATRIC: Normal mood and affect. Normal behavior. Normal judgment and thought  content. CARDIOVASCULAR: Normal heart rate noted, regular rhythm RESPIRATORY: Effort and breath sounds normal, no problems with respiration noted ABDOMEN: Soft, nontender, nondistended, gravid. MUSCULOSKELETAL: Normal range of motion. No edema and no tenderness. 2+ distal pulses.  Cervical Exam: Dilatation  9 cm   Effacement 100%   Station 0   Presentation: cephalic FHT:  Baseline rate 145/140 bpm   Variability moderate  Accelerations present   Decelerations none Contractions: Every 3-5 mins   Pertinent Labs/Studies:   Results for orders placed or performed during the hospital encounter of 07/11/16 (from the past 24 hour(s))  CBC     Status: Abnormal   Collection Time: 07/11/16  3:01 PM  Result Value Ref Range   WBC 13.5 (H) 3.6 - 11.0 K/uL   RBC 4.66 3.80 - 5.20 MIL/uL   Hemoglobin 14.6 12.0 - 16.0 g/dL   HCT 16.142.6 09.635.0 - 04.547.0 %   MCV 91.5 80.0 - 100.0 fL   MCH 31.5 26.0 - 34.0 pg   MCHC 34.4 32.0 - 36.0 g/dL   RDW 40.913.3 81.111.5 - 91.414.5 %   Platelets 196 150 - 440 K/uL  Type and screen [facility]     Status: None   Collection Time: 07/11/16  3:01 PM  Result Value Ref Range   ABO/RH(D) O POS    Antibody Screen NEG    Sample Expiration 07/14/2016     Assessment : Shannon Brady is a 25 y.o. G3P2002 at 1265w5d being admitted for active preterm labor with mono-di twin gestation.  Plan: Labor: Expectant management. Augmentation as needed, per protocol.  Received initial dose of antenatal steroids in clinic today at approximately 12:30 p.m.  S/p Neonatology consult.  FWB: Reassuring fetal heart tracing.  GBS positive.  Treated with dose of IV Clindamycin 900 mg.  Delivery plan: Hopeful for vaginal delivery soon.     Hildred LaserAnika Niall Illes, MD [facility]

## 2016-07-12 ENCOUNTER — Encounter
Admission: RE | Disposition: A | Discharge status: Home / Self Care | Payer: Self-pay | Source: Home / Self Care | Attending: Obstetrics and Gynecology

## 2016-07-12 ENCOUNTER — Inpatient Hospital Stay: Payer: Medicaid Other | Admitting: Anesthesiology

## 2016-07-12 LAB — CBC
HEMATOCRIT: 38.5 % (ref 35.0–47.0)
HEMOGLOBIN: 13.4 g/dL (ref 12.0–16.0)
MCH: 32.1 pg (ref 26.0–34.0)
MCHC: 34.8 g/dL (ref 32.0–36.0)
MCV: 92.2 fL (ref 80.0–100.0)
Platelets: 179 10*3/uL (ref 150–440)
RBC: 4.17 MIL/uL (ref 3.80–5.20)
RDW: 13.1 % (ref 11.5–14.5)
WBC: 23.7 10*3/uL — ABNORMAL HIGH (ref 3.6–11.0)

## 2016-07-12 LAB — RPR: RPR Ser Ql: NONREACTIVE

## 2016-07-12 SURGERY — LIGATION, FALLOPIAN TUBE, POSTPARTUM
Anesthesia: Choice

## 2016-07-12 SURGICAL SUPPLY — 25 items
BLADE SURG SZ11 CARB STEEL (BLADE) IMPLANT
CHLORAPREP W/TINT 26ML (MISCELLANEOUS) IMPLANT
DRAPE LAPAROTOMY 100X77 ABD (DRAPES) IMPLANT
DRSG TEGADERM 2-3/8X2-3/4 SM (GAUZE/BANDAGES/DRESSINGS) IMPLANT
GAUZE SPONGE NON-WVN 2X2 STRL (MISCELLANEOUS) IMPLANT
GLOVE BIO SURGEON STRL SZ 6.5 (GLOVE) IMPLANT
GLOVE BIO SURGEONS STRL SZ 6.5 (GLOVE)
GLOVE INDICATOR 7.0 STRL GRN (GLOVE) IMPLANT
GOWN STRL REUS W/ TWL LRG LVL3 (GOWN DISPOSABLE) IMPLANT
GOWN STRL REUS W/TWL LRG LVL3 (GOWN DISPOSABLE)
KIT RM TURNOVER CYSTO AR (KITS) IMPLANT
LABEL OR SOLS (LABEL) IMPLANT
LIQUID BAND (GAUZE/BANDAGES/DRESSINGS) IMPLANT
NEEDLE HYPO 25GX1X1/2 BEV (NEEDLE) IMPLANT
NS IRRIG 500ML POUR BTL (IV SOLUTION) IMPLANT
PACK BASIN MINOR ARMC (MISCELLANEOUS) IMPLANT
SPONGE VERSALON 2X2 STRL (MISCELLANEOUS)
SUT MNCRL 4-0 (SUTURE)
SUT MNCRL 4-0 27XMFL (SUTURE)
SUT PLAIN GUT 0 (SUTURE) IMPLANT
SUT VIC AB 0 CT1 36 (SUTURE) IMPLANT
SUT VIC AB 0 SH 27 (SUTURE) IMPLANT
SUT VICRYL 0 AB UR-6 (SUTURE) IMPLANT
SUTURE MNCRL 4-0 27XMF (SUTURE) IMPLANT
SYRINGE 10CC LL (SYRINGE) IMPLANT

## 2016-07-12 NOTE — Progress Notes (Signed)
Post Partum Day # 1, s/p SVD (mono-di twins)  Subjective: no complaints, up ad lib, voiding and tolerating PO.  Has been NPO since midnight.   Objective: Temp:  [98 F (36.7 C)-98.2 F (36.8 C)] 98 F (36.7 C) (07/21 0536) Pulse Rate:  [66-89] 74 (07/21 0536) Resp:  [16-18] 18 (07/20 2108) BP: (119-142)/(66-89) 119/66 mmHg (07/21 0536) SpO2:  [97 %] 97 % (07/21 0105) Weight:  [180 lb (81.647 kg)-180 lb 14.4 oz (82.056 kg)] 180 lb (81.647 kg) (07/20 1444)  Physical Exam:  General: alert and no distress  Lungs: clear to auscultation bilaterally Breasts: normal appearance, no masses or tenderness Heart: regular rate and rhythm, S1, S2 normal, no murmur, click, rub or gallop Pelvis: Lochia: appropriate, Uterine Fundus: firm Extremities: DVT Evaluation: Negative Homan's sign. No cords or calf tenderness. No significant calf/ankle edema.   Recent Labs  07/11/16 1501 07/12/16 0657  HGB 14.6 13.4  HCT 42.6 38.5    Assessment/Plan: Plan for discharge tomorrow, Breastfeeding, Lactation consult and Contraception plans for postpartum BTL this morning.    LOS: 1 day   Hildred LaserAnika Devell Parkerson [facility]

## 2016-07-12 NOTE — Clinical Social Work Maternal (Signed)
  CLINICAL SOCIAL WORK MATERNAL/CHILD NOTE  Patient Details  Name: Shannon Brady MRN: [id number] Date of Birth: Aug 03, 1991  Date:  07/12/2016  Clinical Social Worker Initiating Note:   Mel Almond Layn Kye, [place] [phone]) Date/ Time Initiated:  07/12/16/1437     Child's Name:   Shannon Brady and [place] (Twin Boys) )   Legal Guardian:  Mother   Need for Interpreter:  None   Date of Referral:  07/12/16     Reason for Referral:  Other (Comment) (SCN admission )   Referral Source:  Other (Comment)   Address:   ([place] [address])  Phone number:      Household Members:  Self, Minor Children, Spouse   Natural Supports (not living in the home):  Immediate Family, Parent   Professional Supports:     Employment: Homemaker   Type of Work:     Education:  Diplomatic Services operational officer Resources:  Medicaid   Other Resources:      Cultural/Religious Considerations Which May Impact Care:  N/A   Strengths:  Ability to meet basic needs , Compliance with medical plan , Home prepared for child    Risk Factors/Current Problems:  None   Cognitive State:  Alert , Linear Thinking    Mood/Affect:  Happy , Calm , Bright    CSW Assessment: Clinical Education officer, museum (CSW) met with mother and family to provide support for a new SCN admission. Per RN mother is appropriate and there are no concerns at this time. CSW first met with mother's family at bedside. Maternal grandmother, maternal great grandparents, and 2 daughters were at bedside. CSW introduced self and explained role of CSW department. Per family mother is in the SCN feeding the twins. Per maternal grandmother she lives in [place] and mother and father live in [place]. Family spoke fondly of mother and reported that she is a very good mother and very strong. Per family mother delivered the twin boys without any pain relief or medical interventions. Per family that was mother's plan to have a natural birth.    CSW also met with mother and twin boys in the SCN. Per Mother her and her husband Juanda Crumble live in [place] and have all the supplies needed for the twins. Mother appeared happy and calm. Mother has no concerns at this time. CSW will continue to follow and assist as needed.   CSW Plan/Description:  No Further Intervention Required/No Barriers to Discharge    Signora Zucco, Lenice Llamas 07/12/2016, 2:39 PM

## 2016-07-12 NOTE — Lactation Note (Addendum)
This note was copied from a baby's chart. Lactation Consultation Note  Patient Name: Shannon Brady Idonia Schey ZOXWR'UToday's Date: 07/12/2016 Reason for consult: Follow-up assessment;NICU baby   Maternal Data    Feeding Feeding Type: Breast Fed Length of feed: 20 min  LATCH Score/Interventions Latch: Grasps breast easily, tongue down, lips flanged, rhythmical sucking. Intervention(s): Waking techniques;Skin to skin  Audible Swallowing: Spontaneous and intermittent Intervention(s): Skin to skin;Hand expression  Type of Nipple: Everted at rest and after stimulation  Comfort (Breast/Nipple): Soft / non-tender     Hold (Positioning): Assistance needed to correctly position infant at breast and maintain latch. Intervention(s): Skin to skin  LATCH Score: 9  Lactation Tools Discussed/Used  Mom can "possibly" use a nipple shield for right breast, size 20mm.Marland Kitchen.Marland Kitchen.Left breast has inverted nipple and mom uses a 24mm shield at times.    Consult Status Consult Status: Follow-up Date: 07/13/16 Follow-up type: In-patient    Burnadette PeterJaniya M Clifford Benninger 07/12/2016, 4:14 PM

## 2016-07-12 NOTE — Lactation Note (Signed)
This note was copied from a baby's chart. Lactation Consultation Note  Patient Name: Ouida SillsBoyA Blondina Courter ZOXWR'UToday's Date: 07/12/2016 Reason for consult: NICU baby;Follow-up assessment   Maternal Data    Feeding Feeding Type: Breast Fed  LATCH Score/Interventions Latch: Repeated attempts needed to sustain latch, nipple held in mouth throughout feeding, stimulation needed to elicit sucking reflex. Intervention(s): Adjust position;Assist with latch;Breast massage  Audible Swallowing: A few with stimulation Intervention(s): Skin to skin;Hand expression;Alternate breast massage  Type of Nipple: Inverted Intervention(s): Reverse pressure;Shells (nipplette)  Comfort (Breast/Nipple): Soft / non-tender     Hold (Positioning): Assistance needed to correctly position infant at breast and maintain latch.  LATCH Score: 5  Lactation Tools Discussed/Used Tools: Nipple Shields Nipple shield size: 24 (for left breast only)  Mom can "possibly" use a nipple shield for right breast, size 20mm.Marland Kitchen.Marland Kitchen.Left breast has inverted nipple and mom uses a 24mm shield at times.    Consult Status Consult Status: Follow-up    Burnadette PeterJaniya M Maresha Anastos 07/12/2016, 4:16 PM

## 2016-07-13 MED ORDER — DOCUSATE SODIUM 100 MG PO CAPS: 100.0000 mg | ORAL_CAPSULE | Freq: Two times a day (BID) | ORAL | Status: DC | PRN

## 2016-07-13 MED ORDER — IBUPROFEN 600 MG PO TABS: 600.0000 mg | ORAL_TABLET | Freq: Four times a day (QID) | ORAL | Status: DC

## 2016-07-13 NOTE — Discharge Summary (Signed)
Reviewed D/C instructions with patient including when to call the MD, f/u appointment instructions, physical restrictions, prescriptions, and postpartum care at home.  Addressed patient questions as needed.  Obtained a signed copy of the D/C instructions for the patient file and provided a signed copy to the patient.  Discharged patient home via wheelchair escorted by nursing staff. 

## 2016-07-13 NOTE — Discharge Instructions (Signed)
General Postpartum Discharge Instructions  Do not drink alcohol or take tranquilizers.  Do not take medicine that has not been prescribed by your doctor.  Take showers instead of baths until your doctor gives you permission to take baths.  No sexual intercourse or placement of anything in the vagina for 6 weeks or as instructed by your doctor. Only take prescription or over-the-counter medicines  for pain, discomfort, or fever as directed by your doctor. Take medicines (antibiotics) that kill germs if they are prescribed for you.   Call the office or go to the Emergency Room if:  You feel sick to your stomach (nauseous).  You start to throw up (vomit).  You have trouble eating or drinking.  You have an oral temperature above 101.  You have constipation that is not helped by adjusting diet or increasing fluid intake. Pain medicines are a common cause of constipation.  You have foul smelling vaginal discharge or odor.  You have bleeding requiring changing more than 1 pad per hour. You have any other concerns.  SEEK IMMEDIATE MEDICAL CARE IF:  You have persistent dizziness.  You have difficulty breathing or shortness of breath.  You have an oral temperature above 102.5, not controlled by medicine.   Call your doctor for increased pain or vaginal bleeding, temperature above 100.4, depression, or concerns.  No strenuous activity or heavy lifting for 6 weeks.  No intercourse, tampons, douching, or enemas for 6 weeks.  No tub baths-showers only.  No driving for 2 weeks or while taking pain medications.  Continue prenatal vitamin and iron.  Increase calories and fluids while breastfeeding or pumping.

## 2016-07-13 NOTE — Discharge Summary (Signed)
Obstetric Discharge Summary Reason for Admission: onset of labor Prenatal Procedures: NST and ultrasound Intrapartum Procedures: spontaneous vaginal delivery and breech extraction Postpartum Procedures: none Complications-Operative and Postpartum: none HEMOGLOBIN  Date Value Ref Range Status  07/12/2016 13.4 12.0 - 16.0 g/dL Final  46/28/6381 77.1 g/dL Final   HGB  Date Value Ref Range Status  03/24/2013 12.9 12.0-16.0 g/dL Final   HCT  Date Value Ref Range Status  07/12/2016 38.5 35.0 - 47.0 % Final  01/19/2016 40 % Final  03/25/2013 37.4 35.0-47.0 % Final   HEMATOCRIT  Date Value Ref Range Status  05/29/2016 39.7 34.0 - 46.6 % Final    Physical Exam:  General: alert and no distress Lochia: appropriate Uterine Fundus: firm Incision: None DVT Evaluation: Negative Homan's sign. No cords or calf tenderness. No significant calf/ankle edema.  Discharge Diagnoses: Premature labor ([redacted] weeks gestation) and Mono-di twin pregnancy - delivered  Discharge Information: Date: 07/13/2016 Activity: pelvic rest Diet: routine Medications: PNV, Ibuprofen and Colace Condition: stable Instructions: refer to practice specific booklet Discharge to: home   Follow-up Information    Follow up with Hildred Laser, MD In 6 weeks.   Specialties:  Obstetrics and Gynecology, Radiology   Why:  Postpartum visit   Contact information:   [address] [place] [address] [phone]       Newborn Data:   Lakeshia, Curti [[id number]]  Live born female  Birth Weight: 4 lb 12.5 oz (2170 g) APGAR: 8, 9   Deianeira, Schabacker [[id number]]  Live born female  Birth Weight: 4 lb 14 oz (2210 g) APGAR: 7, 9  To remain in NICU for prematurity.   Hildred Laser 07/13/2016, 12:17 PM

## 2016-07-13 NOTE — Progress Notes (Signed)
Post Partum Day # 2, s/p SVD (mono-di twins)  Subjective: no complaints, up ad lib, voiding and tolerating PO.  Changed mind regarding postpartum BTL yesterday.   Objective: Temp:  [97.8 F (36.6 C)-98.6 F (37 C)] 98.6 F (37 C) (07/22 0930) Pulse Rate:  [74-83] 83 (07/22 0930) Resp:  [18] 18 (07/22 0930) BP: (114-130)/(77-89) 128/89 mmHg (07/22 0930) SpO2:  [100 %] 100 % (07/22 0930)  Physical Exam:  General: alert and no distress  Lungs: clear to auscultation bilaterally Breasts: normal appearance, no masses or tenderness Heart: regular rate and rhythm, S1, S2 normal, no murmur, click, rub or gallop Pelvis: Lochia: appropriate, Uterine Fundus: firm Extremities: DVT Evaluation: Negative Homan's sign. No cords or calf tenderness. No significant calf/ankle edema.   Recent Labs  07/11/16 1501 07/12/16 0657  HGB 14.6 13.4  HCT 42.6 38.5    Assessment/Plan: Discharge home in a.m., Breastfeeding and Contraception interval BTL after 6 week postpartum visit   Infants to remain in NICU for prematurity. Otherwise doing well. Circumcision prior to discharge   LOS: 2 days   Hildred Laser, MD [facility]

## 2016-07-18 ENCOUNTER — Ambulatory Visit: Payer: Self-pay

## 2016-07-18 NOTE — Lactation Note (Signed)
This note was copied from a baby's chart. Lactation Consultation Note  Patient Name: Shannon Brady Date: 07/18/2016 Reason for consult: Follow-up assessment   Maternal Data  Spoke with mother about offering the babies the breast. She could start by offering each baby the breast once or twice a day when very awake for 10 to 20 minutes then complete feed by bottle or ngtube. Not weighing the baby as it is causing the mother a lot of anxiety and she is perceiving the attempts as unsuccessful. Feeding Feeding Type: Breast Milk Nipple Type: Slow - flow Length of feed: 45 min (30 PO; 15 gavage)                        Lactation Tools Discussed/Used Tools: 25F feeding tube / Syringe;Bottle   Consult Status      Trudee Grip 07/18/2016, 2:30 PM

## 2016-07-22 NOTE — Progress Notes (Signed)
NST performed today was reviewed and was found to be reactive.  Continue recommended antenatal testing and prenatal care.  

## 2016-07-29 ENCOUNTER — Telehealth: Payer: Self-pay

## 2016-07-29 NOTE — Telephone Encounter (Signed)
Called pt to f/u with call a nurse over the weekend, pt was started on clarithromycin. Pt states she has been complaint with antibiotic, also Tylenol every 6hrs as needed for fever. PT states that she is doing much better, using compresses and pumping frequently. To call back if sx worsen or change.

## 2016-07-30 ENCOUNTER — Ambulatory Visit (INDEPENDENT_AMBULATORY_CARE_PROVIDER_SITE_OTHER): Payer: Medicaid Other | Admitting: Obstetrics and Gynecology

## 2016-07-30 ENCOUNTER — Encounter: Payer: Self-pay | Admitting: Obstetrics and Gynecology

## 2016-07-30 VITALS — BP 101/68 | HR 112 | Ht 65.0 in | Wt 151.2 lb

## 2016-07-30 DIAGNOSIS — O9122 Nonpurulent mastitis associated with the puerperium: Secondary | ICD-10-CM

## 2016-07-30 DIAGNOSIS — R102 Pelvic and perineal pain: Secondary | ICD-10-CM

## 2016-07-30 DIAGNOSIS — K5900 Constipation, unspecified: Secondary | ICD-10-CM

## 2016-07-30 MED ORDER — CLINDAMYCIN HCL 300 MG PO CAPS: 300.0000 mg | ORAL_CAPSULE | Freq: Four times a day (QID) | ORAL | 0 refills | Status: DC

## 2016-07-30 NOTE — Progress Notes (Signed)
    GYNECOLOGY CLINIC PROGRESS NOTE  Subjective:    Patient ID: Shannon Brady, female    DOB: 12/19/1991, 25 y.o.   MRN: [id number]  HPI  Patient is a 25 y.o. 603P2102 female who presents for complaints of persistent mastitis.  Patient contacted telephone nurse over the weekend secondary to fever, rash and tenderness of right breast, and flu-like symptoms.  Was prescribed Cipro over the weekend (secondary to PCN allergy).  Notes that right the right breast rash and tenderness has improved, however has spread to the left breast.  Is still noting high fevers (around 101), last fever last night.  Is alternating with Tylenol and Motrin. Also still noting chills.  Has been on current antibiotics x 4 days.   The following portions of the patient's history were reviewed and updated as appropriate: allergies, current medications, past family history, past medical history, past social history, past surgical history and problem list.  Review of Systems A comprehensive review of systems was negative except for: what's noted in HPI, and pain in lower pelvis (feels like aching, tugging) which sometimes radiates to the back bilaterally), and noting constipation   Objective:   Blood pressure 101/68, pulse (!) 112, height 5\' 5"  (1.651 m), weight 151 lb 3.2 oz (68.6 kg), currently breastfeeding. General appearance: alert and no distress Breasts: left breast with red streaking rash, mildly tender.  Right breast without redness, mildly tender with enlarged palpable ducts.  Abdomen: soft, non-tender; bowel sounds normal; no masses,  no organomegaly   Assessment:   Postpartum mastitis Tachycardia Pelvic pain (mild) Constipation   Plan:   1. Postpartum mastitis with tachycardia - will change prescription from Cipro to Clindamycin as patient only noting partial response to current antibiotics.  Advised patient to contact MD if symptoms worsen or fail to improve over next several days.  2. Pelvic pain,  mild.  May be secondary to constipation as she notes that she does not have a bowel movement for up to 3-4 days at at tie (is unusual for her).  3. Constipation - states that she discarded prescription for stool softener as she was originally experiencing diarrhea with it.  Will refill and advise on taking once daily.    Hildred LaserAnika Cammi Consalvo, MD [facility]

## 2016-08-27 ENCOUNTER — Encounter: Payer: Self-pay | Admitting: Obstetrics and Gynecology

## 2016-08-27 ENCOUNTER — Ambulatory Visit (INDEPENDENT_AMBULATORY_CARE_PROVIDER_SITE_OTHER): Payer: Medicaid Other | Admitting: Obstetrics and Gynecology

## 2016-08-27 DIAGNOSIS — Z302 Encounter for sterilization: Secondary | ICD-10-CM

## 2016-08-27 MED ORDER — CITRANATAL 90 DHA 90-1 & 300 MG PO MISC: 1.0000 | Freq: Every day | ORAL | 11 refills | Status: DC

## 2016-08-27 NOTE — Patient Instructions (Signed)
You are scheduled for surgery on 08/23/2016.  Nothing to eat after midnight on day prior to surgery.  Do not take any medications unless recommended by your provider on day prior to surgery.  Do not take NSAIDs (Motrin, Aleve) or aspirin 7 days prior to surgery.  You may take Tylenol products for minor aches and pains.  You will receive a prescription for pain medications post-operatively.  You will be contacted by phone approximately 1 week prior to surgery to schedule pre-operative appointment.  Please call the office if you have any questions regarding your upcoming surgery.

## 2016-08-27 NOTE — Progress Notes (Signed)
   OBSTETRICS POSTPARTUM CLINIC PROGRESS NOTE  Subjective:     Shannon Brady is a 25 y.o. 463P2102 female who presents for a postpartum visit. She is 6 weeks postpartum following a spontaneous vaginal delivery of mono-di twin gestation. I have fully reviewed the prenatal and intrapartum course. The delivery was at 34 gestational weeks.  Anesthesia: none. Postpartum course has been well (although complicated by mastitis. Babies' course has been well. Baby is feeding by breast pumping. Bleeding: patient has/has not resumed menses, with last menstrual period of 08/15/2016. Bowel function is normal. Bladder function is normal. Patient is not sexually active. Contraception method desired is tubal ligation. Postpartum depression screening: negative.  The following portions of the patient's history were reviewed and updated as appropriate: allergies, current medications, past family history, past medical history, past social history, past surgical history and problem list.  Review of Systems Pertinent items noted in HPI and remainder of comprehensive ROS otherwise negative.   Objective:    Ht 5\' 5"  (1.651 m)   Wt 147 lb 9.6 oz (67 kg)   LMP 08/15/2016   Breastfeeding? Yes   BMI 24.56 kg/m   General:  alert and no distress   Breasts:  inspection negative, no nipple discharge or bleeding, no masses or nodularity palpable  Lungs: clear to auscultation bilaterally  Heart:  regular rate and rhythm, S1, S2 normal, no murmur, click, rub or gallop  Abdomen: soft, non-tender; bowel sounds normal; no masses,  no organomegaly.     Vulva:  normal  Vagina: normal vagina, no discharge, exudate, lesion, or erythema  Cervix:  no cervical motion tenderness and no lesions  Corpus: normal size, contour, position, consistency, mobility, non-tender  Adnexa:  normal adnexa and no mass, fullness, tenderness  Rectal Exam: Not performed.         Labs:  Lab Results  Component Value Date   HGB 13.4 07/12/2016     Assessment:   Routine postpartum exam.   Contraception management - desires BTL.    Plan:    1. Contraception: tubal ligation.  Patient previously counseled, had signed tubal papers during pregnancy.  Desired to postpone immediate PP BTL due to infants in NICU.  Now ready to proceed as infants at home. Scheduled for 09/09/16. 2.  Will have 2 week post-op check following surgery.    Hildred LaserAnika Garv Kuechle, MD [facility]

## 2016-08-27 NOTE — H&P (Signed)
GYNECOLOGY PRE-OPERATIVE HISTORY AND PHYSICAL  Subjective:    Patient is a 25 y.o. [id number] female scheduled for interval laparoscopic bilateral tubal ligation. Indications for procedure are multiparity, desiring permanent sterilization.   Pertinent Gynecological History: Menses: regular every month without intermenstrual spotting Contraception: abstinence Last pap: normal Date: 01/19/2016.  Menarche age: [age] Patient's last menstrual period was 08/15/2016.    Discussed Blood/Blood Products: no    OB History  Gravida Para Term Preterm AB Living  3 3 2 1   4   SAB TAB Ectopic Multiple Live Births        1 4    # Outcome Date GA Lbr Len/2nd Weight Sex Delivery Anes PTL Lv  3A Preterm 07/11/16 9412w5d  4 lb 12.5 oz (2.17 kg) M Vag-Spont None  LIV  3B Preterm 07/11/16 9812w5d  4 lb 14 oz (2.21 kg) M Vag-Spont None  LIV  2 Term 03/24/13    F Vag-Spont   LIV  1 Term 09/16/10    F Vag-Spont   LIV      Past Medical History:  Diagnosis Date  . Medical history non-contributory     Past Surgical History:  Procedure Laterality Date  . NO PAST SURGERIES       History reviewed. No pertinent family history.   Social History   Social History  . Marital status: Married    Spouse name: N/A  . Number of children: N/A  . Years of education: N/A   Social History Main Topics  . Smoking status: Never Smoker  . Smokeless tobacco: Never Used  . Alcohol use No  . Drug use: No  . Sexual activity: Not Currently    Birth control/ protection: None   Other Topics Concern  . None   Social History Narrative  . None    Current Outpatient Prescriptions on File Prior to Visit  Medication Sig Dispense Refill  . Prenatal Vit-Fe Fumarate-FA (PRENATAL MULTIVITAMIN) TABS tablet Take 1 tablet by mouth daily at 12 noon.     No current facility-administered medications on file prior to visit.      Allergies  Allergen Reactions  . Penicillins Itching    Review of  Systems Constitutional: No recent fever/chills/sweats Respiratory: No recent cough/bronchitis Cardiovascular: No chest pain Gastrointestinal: No recent nausea/vomiting/diarrhea Genitourinary: No UTI symptoms Hematologic/lymphatic:No history of coagulopathy or recent blood thinner use    Objective:    Ht 5\' 5"  (1.651 m)   Wt 147 lb 9.6 oz (67 kg)   LMP 08/15/2016   Breastfeeding? Yes   BMI 24.56 kg/m   General:   Normal  Skin:   normal  HEENT:  Normal  Neck:  Supple without Adenopathy or Thyromegaly  Lungs:   Heart:              Breasts:   Abdomen:  Pelvis:  M/S   Extremeties:  Neuro:    clear to auscultation bilaterally   Normal without murmur   Not Examined   soft, non-tender; bowel sounds normal; no masses,  no organomegaly   Exam deferred to OR  No CVAT  Warm/Dry   Normal          Assessment:    Multiparity, desiring permanent sterilization   Plan:   Patient desires permanent sterilization.  Other reversible forms of contraception were discussed with patient; she declines all other modalities. Risks of procedure discussed with patient including but not limited to: risk of regret, permanence of method, bleeding, infection, injury to surrounding  organs and need for additional procedures.  Failure risk of 1-2 % with increased risk of ectopic gestation if pregnancy occurs was also discussed with patient.  Patient verbalized understanding of these risks and wants to proceed with sterilization. Procedure, risks, reasons, benefits and complications (including injury to bowel, bladder, major blood vessel, ureter, bleeding, possibility of transfusion, infection, or fistula formation) reviewed in detail. Consent previously signed. Preop testing ordered. Instructions reviewed, including NPO after midnight. Surgery scheduled for 09/09/2016.       Hildred Laser, MD [facility]

## 2016-09-01 IMAGING — US US MFM OB FOLLOW-UP
1 series · 14 of 28 positions shown · non-contrast
Comparison: none

[Series 1: us mfm ob follow-up · 0.22mm/px · 14 of 94 slices shown]
[im 4/94]
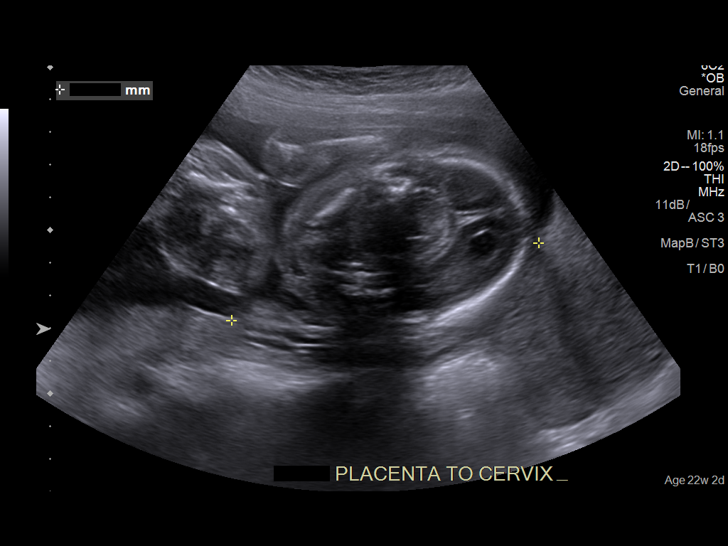
[im 11/94]
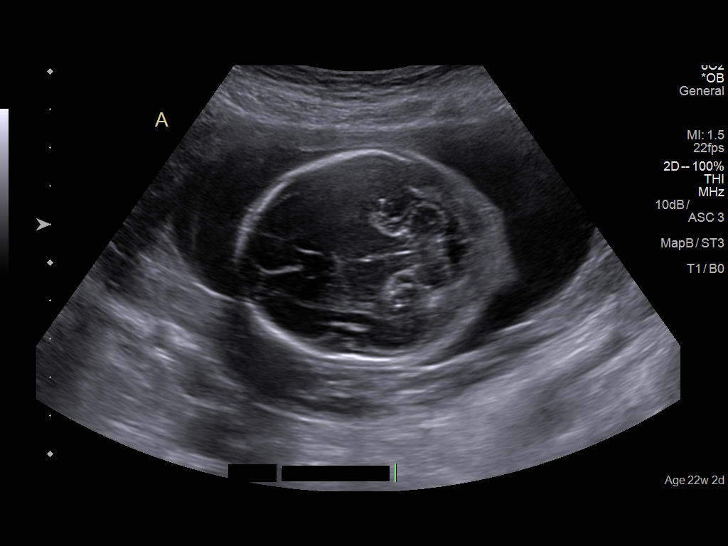
[im 18/94]
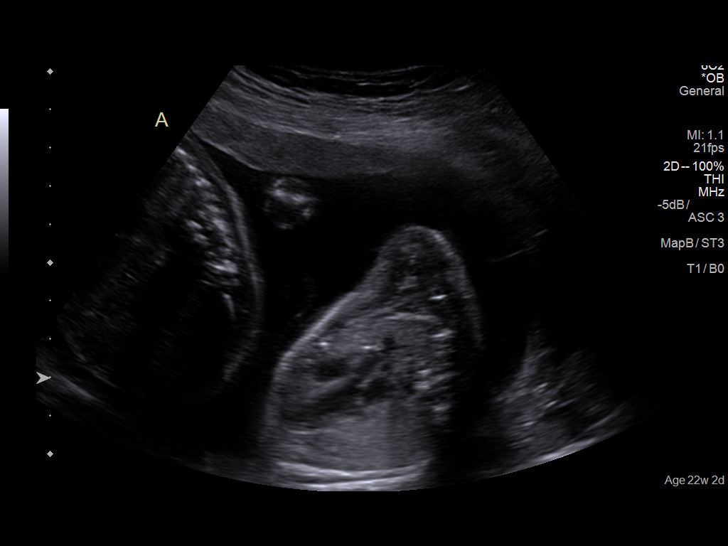
[im 25/94]
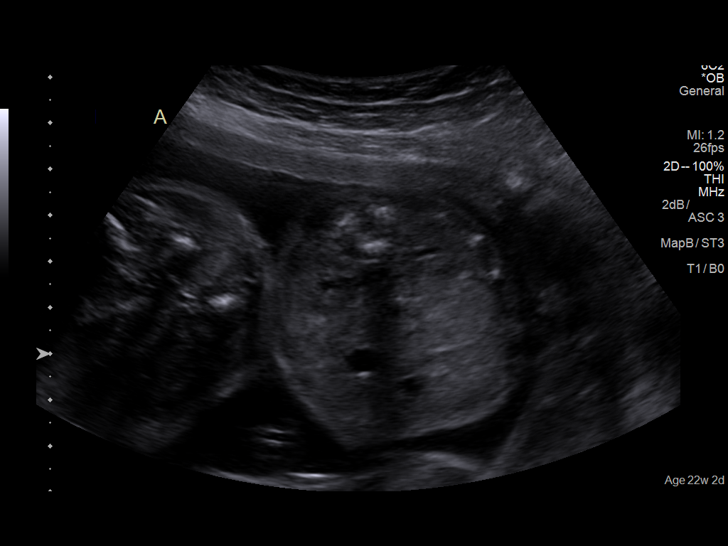
[im 32/94]
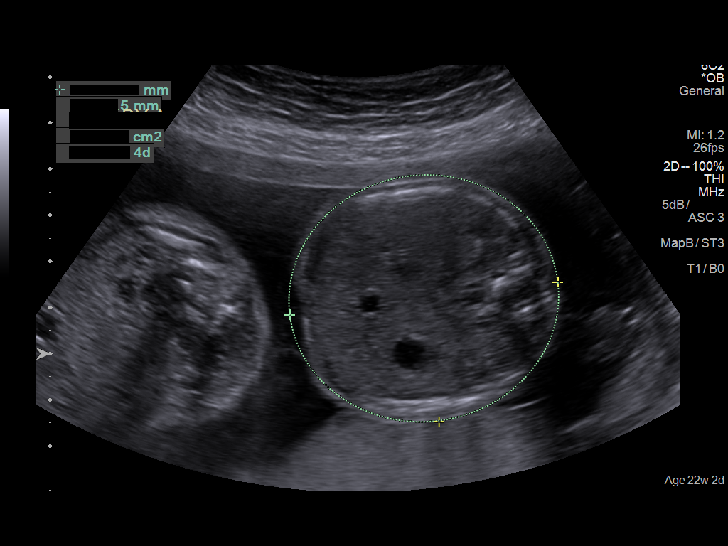
[im 38/94]
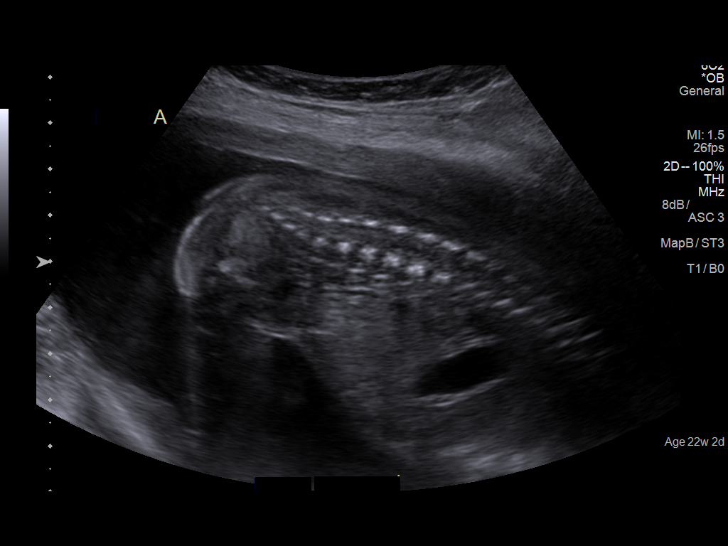
[im 45/94]
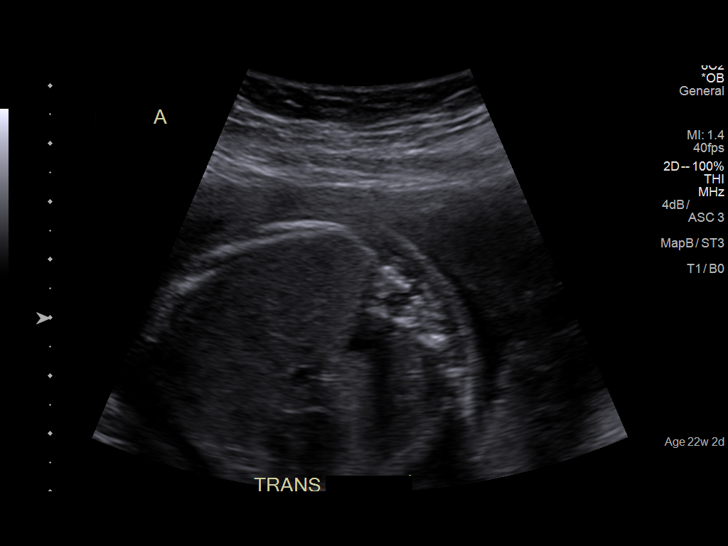
[im 52/94]
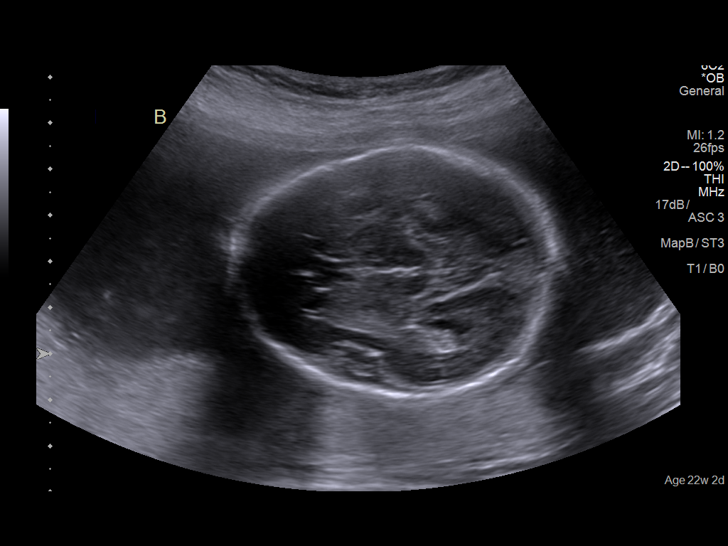
[im 59/94]
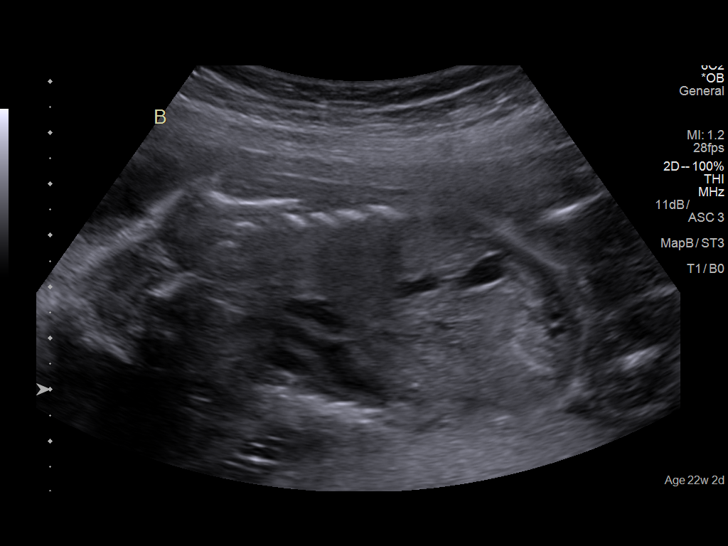
[im 66/94]
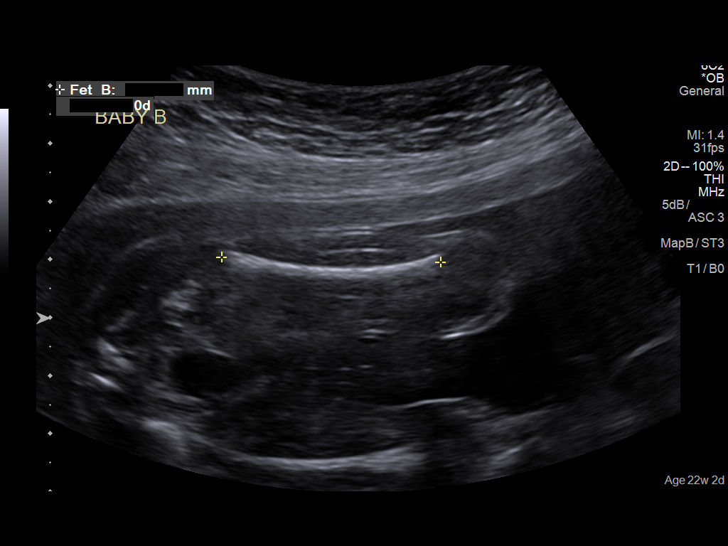
[im 73/94]
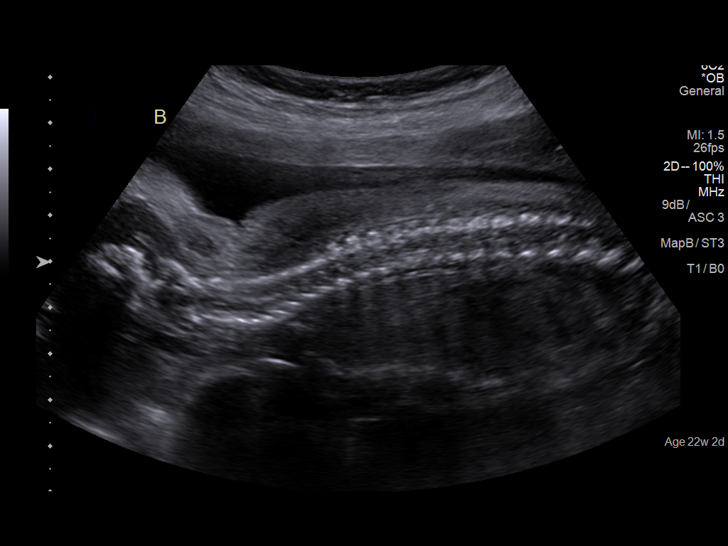
[im 80/94]
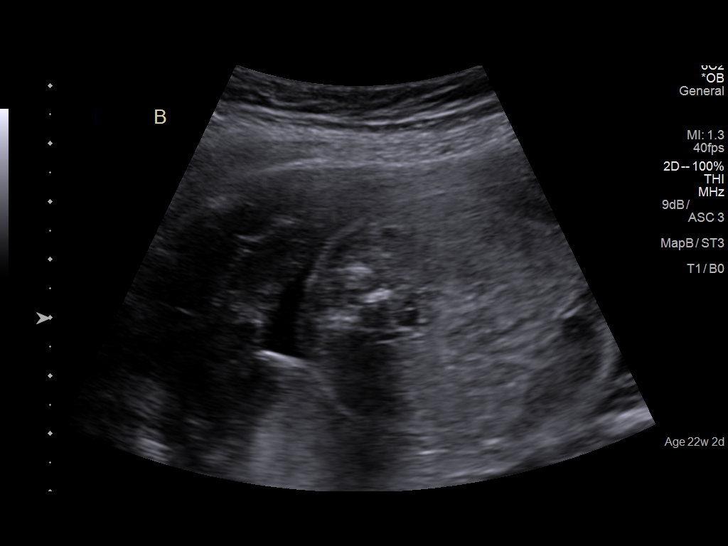
[im 87/94]
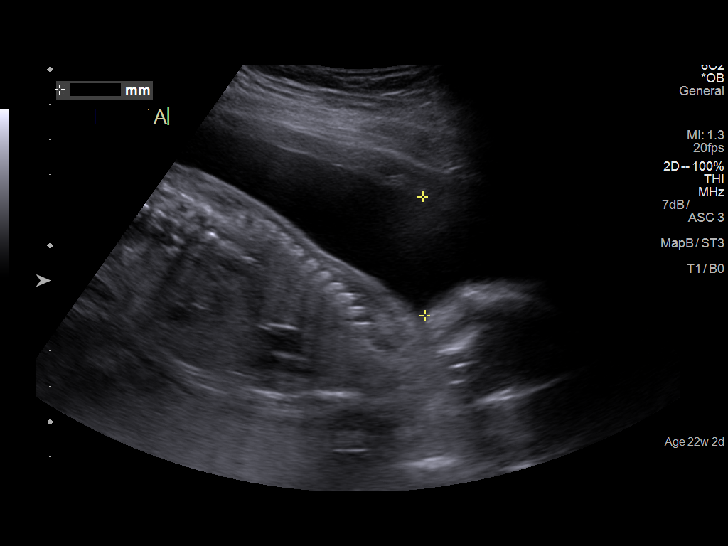
[im 94/94]
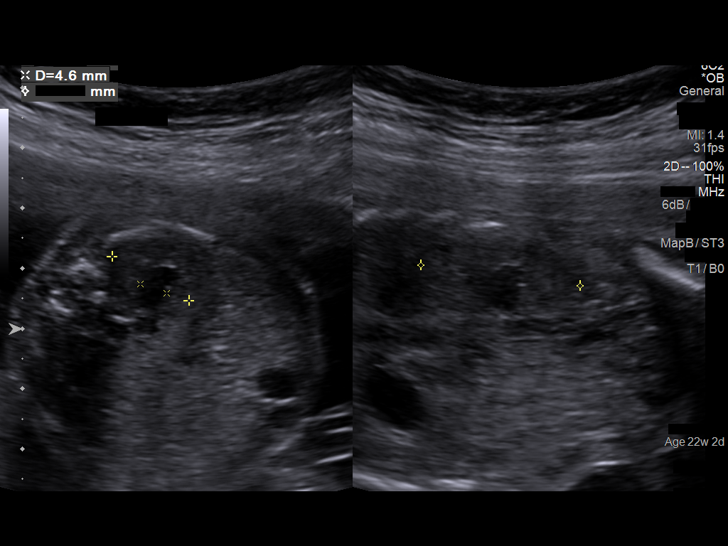

[14 of 28 positions shown; findings below may reference images not displayed]

Canned report from images found in remote index.

Refer to host system for actual result text.

## 2016-09-18 ENCOUNTER — Encounter
Admission: RE | Admit: 2016-09-18 | Discharge: 2016-09-18 | Disposition: A | Discharge status: Home / Self Care | Payer: Medicaid Other | Source: Ambulatory Visit | Attending: Obstetrics and Gynecology | Admitting: Obstetrics and Gynecology

## 2016-09-18 DIAGNOSIS — Z01812 Encounter for preprocedural laboratory examination: Secondary | ICD-10-CM | POA: Insufficient documentation

## 2016-09-18 DIAGNOSIS — Z79899 Other long term (current) drug therapy: Secondary | ICD-10-CM | POA: Insufficient documentation

## 2016-09-18 DIAGNOSIS — Z880 Allergy status to penicillin: Secondary | ICD-10-CM | POA: Insufficient documentation

## 2016-09-18 NOTE — Patient Instructions (Signed)
Your procedure is scheduled on: 09/23/16 Report to Day Surgery. 2ND FLOOR MEDICAL MALL ENTRANCE To find out your arrival time please call [phone] between 1PM - 3PM on 09/20/16.  Remember: Instructions that are not followed completely may result in serious medical risk, up to and including death, or upon the discretion of your surgeon and anesthesiologist your surgery may need to be rescheduled.    __X__ 1. Do not eat food or drink liquids after midnight. No gum chewing or hard candies.     __X__ 2. No Alcohol for 24 hours before or after surgery.   ____ 3. Bring all medications with you on the day of surgery if instructed.    __X__ 4. Notify your doctor if there is any change in your medical condition     (cold, fever, infections).     Do not wear jewelry, make-up, hairpins, clips or nail polish.  Do not wear lotions, powders, or perfumes.   Do not shave 48 hours prior to surgery. Men may shave face and neck.  Do not bring valuables to the hospital.    [facility] is not responsible for any belongings or valuables.               Contacts, dentures or bridgework may not be worn into surgery.  Leave your suitcase in the car. After surgery it may be brought to your room.  For patients admitted to the hospital, discharge time is determined by your                treatment team.   Patients discharged the day of surgery will not be allowed to drive home.   Please read over the following fact sheets that you were given:     ____ Take these medicines the morning of surgery with A SIP OF WATER:    1. NONE  2.   3.   4.  5.  6.  ____ Fleet Enema (as directed)   __X__ Use CHG Soap as directed  ____ Use inhalers on the day of surgery  ____ Stop metformin 2 days prior to surgery    ____ Take 1/2 of usual insulin dose the night before surgery and none on the morning of surgery.   ____ Stop Coumadin/Plavix/aspirin on   ____ Stop Anti-inflammatories on    ____ Stop  supplements until after surgery.    ____ Bring C-Pap to the hospital.

## 2016-09-19 ENCOUNTER — Encounter
Admission: RE | Admit: 2016-09-19 | Discharge: 2016-09-19 | Disposition: A | Discharge status: Home / Self Care | Payer: Medicaid Other | Source: Ambulatory Visit | Attending: Obstetrics and Gynecology | Admitting: Obstetrics and Gynecology

## 2016-09-19 DIAGNOSIS — Z01812 Encounter for preprocedural laboratory examination: Secondary | ICD-10-CM | POA: Diagnosis present

## 2016-09-19 DIAGNOSIS — Z79899 Other long term (current) drug therapy: Secondary | ICD-10-CM | POA: Diagnosis not present

## 2016-09-19 DIAGNOSIS — Z880 Allergy status to penicillin: Secondary | ICD-10-CM | POA: Diagnosis not present

## 2016-09-19 LAB — CBC
HCT: 38.8 % (ref 35.0–47.0)
Hemoglobin: 13.6 g/dL (ref 12.0–16.0)
MCH: 31.2 pg (ref 26.0–34.0)
MCHC: 35 g/dL (ref 32.0–36.0)
MCV: 89.2 fL (ref 80.0–100.0)
PLATELETS: 246 10*3/uL (ref 150–440)
RBC: 4.35 MIL/uL (ref 3.80–5.20)
RDW: 12.3 % (ref 11.5–14.5)
WBC: 7.7 10*3/uL (ref 3.6–11.0)

## 2016-09-23 ENCOUNTER — Ambulatory Visit: Payer: Medicaid Other | Admitting: Anesthesiology

## 2016-09-23 ENCOUNTER — Ambulatory Visit
Admission: RE | Admit: 2016-09-23 | Discharge: 2016-09-23 | Disposition: A | Discharge status: Home / Self Care | Payer: Medicaid Other | Source: Ambulatory Visit | Attending: Obstetrics and Gynecology | Admitting: Obstetrics and Gynecology

## 2016-09-23 ENCOUNTER — Encounter
Admission: RE | Disposition: A | Discharge status: Home / Self Care | Payer: Self-pay | Source: Ambulatory Visit | Attending: Obstetrics and Gynecology

## 2016-09-23 ENCOUNTER — Encounter: Payer: Self-pay | Admitting: *Deleted

## 2016-09-23 DIAGNOSIS — Z302 Encounter for sterilization: Secondary | ICD-10-CM | POA: Insufficient documentation

## 2016-09-23 HISTORY — PX: TUBAL LIGATION: SHX77

## 2016-09-23 LAB — POCT PREGNANCY, URINE: PREG TEST UR: NEGATIVE

## 2016-09-23 SURGERY — LIGATION, FALLOPIAN TUBE, BILATERAL
Anesthesia: General | Site: Abdomen | Laterality: Bilateral | Wound class: Clean

## 2016-09-23 MED ORDER — SUGAMMADEX SODIUM 200 MG/2ML IV SOLN
INTRAVENOUS | Status: DC | PRN
  Administered 2016-09-23: 120 mg via INTRAVENOUS

## 2016-09-23 MED ORDER — HYDROCODONE-ACETAMINOPHEN 5-325 MG PO TABS: 1.0000 | ORAL_TABLET | Freq: Four times a day (QID) | ORAL | 0 refills | Status: DC | PRN

## 2016-09-23 MED ORDER — OXYCODONE HCL 5 MG/5ML PO SOLN: 5.0000 mg | Freq: Once | ORAL | Status: DC | PRN

## 2016-09-23 MED ORDER — ONDANSETRON HCL 4 MG/2ML IJ SOLN
INTRAMUSCULAR | Status: DC | PRN
  Administered 2016-09-23: 4 mg via INTRAVENOUS

## 2016-09-23 MED ORDER — LIDOCAINE HCL (CARDIAC) 20 MG/ML IV SOLN
INTRAVENOUS | Status: DC | PRN
  Administered 2016-09-23: 50 mg via INTRAVENOUS

## 2016-09-23 MED ORDER — OXYCODONE HCL 5 MG PO TABS: 5.0000 mg | ORAL_TABLET | Freq: Once | ORAL | Status: DC | PRN

## 2016-09-23 MED ORDER — ROCURONIUM BROMIDE 100 MG/10ML IV SOLN
INTRAVENOUS | Status: DC | PRN
Start: 2016-09-23 — End: 2016-09-23
  Administered 2016-09-23: 30 mg via INTRAVENOUS

## 2016-09-23 MED ORDER — FENTANYL CITRATE (PF) 100 MCG/2ML IJ SOLN
INTRAMUSCULAR | Status: AC
  Administered 2016-09-23: 25 ug via INTRAVENOUS
  Filled 2016-09-23: qty 2

## 2016-09-23 MED ORDER — PROPOFOL 10 MG/ML IV BOLUS
INTRAVENOUS | Status: DC | PRN
  Administered 2016-09-23: 180 mg via INTRAVENOUS

## 2016-09-23 MED ORDER — PROMETHAZINE HCL 25 MG/ML IJ SOLN: 6.2500 mg | INTRAMUSCULAR | Status: DC | PRN

## 2016-09-23 MED ORDER — SIMETHICONE 80 MG PO CHEW: 80.0000 mg | CHEWABLE_TABLET | Freq: Four times a day (QID) | ORAL | 0 refills | Status: DC | PRN

## 2016-09-23 MED ORDER — DEXAMETHASONE SODIUM PHOSPHATE 10 MG/ML IJ SOLN
INTRAMUSCULAR | Status: DC | PRN
  Administered 2016-09-23: 10 mg via INTRAVENOUS

## 2016-09-23 MED ORDER — FAMOTIDINE 20 MG PO TABS
20.0000 mg | ORAL_TABLET | Freq: Once | ORAL | Status: AC
  Administered 2016-09-23: 20 mg via ORAL

## 2016-09-23 MED ORDER — FENTANYL CITRATE (PF) 100 MCG/2ML IJ SOLN
25.0000 ug | INTRAMUSCULAR | Status: DC | PRN
  Administered 2016-09-23 (×2): 25 ug via INTRAVENOUS

## 2016-09-23 MED ORDER — LACTATED RINGERS IV SOLN
INTRAVENOUS | Status: DC
  Administered 2016-09-23: 07:00:00 via INTRAVENOUS

## 2016-09-23 MED ORDER — FAMOTIDINE 20 MG PO TABS
ORAL_TABLET | ORAL | Status: AC
  Administered 2016-09-23: 20 mg via ORAL
  Filled 2016-09-23: qty 1

## 2016-09-23 MED ORDER — IBUPROFEN 800 MG PO TABS
800.0000 mg | ORAL_TABLET | Freq: Three times a day (TID) | ORAL | 1 refills | Status: DC | PRN
Start: 2016-09-23 — End: 2016-10-01

## 2016-09-23 MED ORDER — FENTANYL CITRATE (PF) 100 MCG/2ML IJ SOLN
INTRAMUSCULAR | Status: DC | PRN
  Administered 2016-09-23 (×2): 50 ug via INTRAVENOUS

## 2016-09-23 MED ORDER — BUPIVACAINE HCL (PF) 0.5 % IJ SOLN
INTRAMUSCULAR | Status: DC | PRN
  Administered 2016-09-23: 9 mL

## 2016-09-23 MED ORDER — MEPERIDINE HCL 25 MG/ML IJ SOLN: 6.2500 mg | INTRAMUSCULAR | Status: DC | PRN

## 2016-09-23 MED ORDER — MIDAZOLAM HCL 2 MG/2ML IJ SOLN
INTRAMUSCULAR | Status: DC | PRN
  Administered 2016-09-23: 2 mg via INTRAVENOUS

## 2016-09-23 MED ORDER — LACTATED RINGERS IV SOLN: INTRAVENOUS | Status: DC

## 2016-09-23 MED ORDER — SIMETHICONE 80 MG PO CHEW
80.0000 mg | CHEWABLE_TABLET | Freq: Once | ORAL | Status: AC
  Administered 2016-09-23: 80 mg via ORAL
  Filled 2016-09-23: qty 1

## 2016-09-23 SURGICAL SUPPLY — 19 items
BLADE SURG SZ11 CARB STEEL (BLADE) ×3 IMPLANT
CATH ROBINSON RED A/P 16FR (CATHETERS) ×3 IMPLANT
CHLORAPREP W/TINT 26ML (MISCELLANEOUS) ×3 IMPLANT
GLOVE BIO SURGEON STRL SZ 6 (GLOVE) ×3 IMPLANT
GLOVE BIOGEL PI IND STRL 6.5 (GLOVE) ×1 IMPLANT
GLOVE BIOGEL PI INDICATOR 6.5 (GLOVE) ×2
GOWN STRL REUS W/ TWL LRG LVL3 (GOWN DISPOSABLE) ×2 IMPLANT
GOWN STRL REUS W/TWL LRG LVL3 (GOWN DISPOSABLE) ×4
KIT RM TURNOVER CYSTO AR (KITS) ×3 IMPLANT
LABEL OR SOLS (LABEL) ×3 IMPLANT
LIQUID BAND (GAUZE/BANDAGES/DRESSINGS) ×3 IMPLANT
NS IRRIG 500ML POUR BTL (IV SOLUTION) ×3 IMPLANT
PACK GYN LAPAROSCOPIC (MISCELLANEOUS) ×3 IMPLANT
PAD PREP 24X41 OB/GYN DISP (PERSONAL CARE ITEMS) ×3 IMPLANT
SHEARS ENDO 5MM LNG  ASK BEFOR (MISCELLANEOUS) ×2
SHEARS ENDO 5MM LNG ASK BEFOR (MISCELLANEOUS) ×1 IMPLANT
SUT VIC AB 0 CT2 27 (SUTURE) ×3 IMPLANT
TROCAR ENDO BLADELESS 11MM (ENDOMECHANICALS) ×3 IMPLANT
TUBING INSUFFLATOR HI FLOW (MISCELLANEOUS) ×3 IMPLANT

## 2016-09-23 NOTE — H&P (Signed)
UPDATE TO PREVIOUS HISTORY AND PHYSICAL  The patient has been seen and examined.  H&P is up to date, no changes noted. Shannon Brady is a [age] y.o. [phone] female scheduled for interval laparoscopic bilateral tubal ligation. Other reversible forms of contraception were previously discussed with patient; she declined all other modalities. Risks of procedure discussed again with patient including but not limited to: risk of regret, permanence of method, bleeding, infection, injury to surrounding organs and need for additional procedures.  Failure risk of 1-2 % with increased risk of ectopic gestation if pregnancy occurs was also discussed with patient.  Patient verbalized understanding of these risks and wants to proceed with sterilization.  Consent signed on chart. To OR when ready.    Hildred LaserAnika Cicero Noy, MD 09/23/2016 7:18 AM

## 2016-09-23 NOTE — Op Note (Signed)
Procedure(s): LAPAROSCOPIC BILATERAL TUBAL LIGATION Procedure Note  Shannon Brady female 25 y.o. 09/23/2016  Indications: The patient is a 25 y.o. 603P2104 female who desires permanent sterility  Pre-operative Diagnosis: Multiparous, desires permanent sterility  Post-operative Diagnosis: Same  Surgeon: Hildred LaserAnika Janaye Corp, MD  Anesthesia: General endotracheal anesthesia  Findings: The uterus was sounded to 8 cm. Uterus, fallopian tubes, and ovaries appeared normal.   Procedure Details: The patient was seen in the Holding Room. The risks, benefits, complications, treatment options, and expected outcomes were discussed with the patient.  The patient concurred with the proposed plan, giving informed consent.  The site of surgery properly noted/marked. The patient was taken to the Operating Room, identified as Shannon Brady and the procedure verified as Procedure(s) (LRB): LAPAROSCOPIC BILATERAL TUBAL LIGATION (Bilateral).   She was then placed under general anesthesia without difficulty. She was placed in the dorsal lithotomy position, and was prepped and draped in a sterile manner.  A Time Out was held and the above information confirmed.  A straight catheterization was performed. A sterile speculum was inserted into the vagina and the cervix was grasped at the anterior lip using a single-toothed tenaculum.  The uterus was sounded to 8 cm, and a Hulka clamp was placed for uterine manipulation.  The speculum and tenaculum were then removed. After an adequate timeout was performed, attention was turned to the abdomen where an 11-mm umbilical incision was made with the scalpel.  The Optiview 11-mm trocar and sleeve were then advanced without difficulty with the laparoscope under direct visualization into the abdomen.  The abdomen was then insufflated with carbon dioxide gas and adequate pneumoperitoneum was obtained.  A survey of the patient's pelvis and abdomen revealed entirely normal anatomy.    The fallopian tubes were observed and found to be normal in appearance. Bipolar forceps was then advanced through the operative port and used to coagulate a 3 cm portion of the left tube in the mid isthmic area.  Good blanching and coagulation was noted at the site of the application.  There was no bleeding noted in the mesosalpinx.  A similar process was carried out on the right fallopian tube allowing for bilateral tubal sterilization.   Good hemostasis was noted overall.  The cauterized area was then incised with laparoscopic scissors bilaterally. The instruments were then removed from the patient's abdomen and the fascial incision was repaired with 0 Vicryl, and the skin was closed with Dermabond.  The uterine manipulator and the tenaculum were removed from the vagina without complications. The patient tolerated the procedure well.  Sponge, lap, and needle counts were correct times two.  The patient was then taken to the recovery room awake, extubated and in stable condition.  Estimated Blood Loss:  minimal      Drains: straight catheterization prior to procedure with  20 ml of clear urine         Total IV Fluids:  500 ml  Specimens: None         Implants: None         Complications:  None; patient tolerated the procedure well.         Disposition: PACU - hemodynamically stable.         Condition: stable   Hildred LaserAnika Jaeleah Smyser, MD [facility]

## 2016-09-23 NOTE — Anesthesia Preprocedure Evaluation (Signed)
Anesthesia Evaluation  Patient identified by MRN, date of birth, ID band Patient awake    Reviewed: Allergy & Precautions, NPO status , Patient's Chart, lab work & pertinent test results  History of Anesthesia Complications Negative for: history of anesthetic complications  Airway Mallampati: II  TM Distance: >3 FB Neck ROM: Full    Dental no notable dental hx.    Pulmonary neg pulmonary ROS, neg sleep apnea, neg COPD,    breath sounds clear to auscultation- rhonchi (-) wheezing      Cardiovascular Exercise Tolerance: Good (-) hypertension(-) CAD and (-) Past MI negative cardio ROS   Rhythm:Regular Rate:Normal - Systolic murmurs and - Diastolic murmurs    Neuro/Psych negative neurological ROS  negative psych ROS   GI/Hepatic negative GI ROS, Neg liver ROS,   Endo/Other  negative endocrine ROS  Renal/GU negative Renal ROS     Musculoskeletal   Abdominal (+) - obese,   Peds  Hematology negative hematology ROS (+)   Anesthesia Other Findings   Reproductive/Obstetrics (+) Breast feeding                              Anesthesia Physical Anesthesia Plan  ASA: I  Anesthesia Plan: General   Post-op Pain Management:    Induction: Intravenous  Airway Management Planned: Oral ETT  Additional Equipment:   Intra-op Plan:   Post-operative Plan: Extubation in OR  Informed Consent: I have reviewed the patients History and Physical, chart, labs and discussed the procedure including the risks, benefits and alternatives for the proposed anesthesia with the patient or authorized representative who has indicated his/her understanding and acceptance.   Dental advisory given  Plan Discussed with: Anesthesiologist and CRNA  Anesthesia Plan Comments:         Anesthesia Quick Evaluation

## 2016-09-23 NOTE — Anesthesia Procedure Notes (Signed)
Procedure Name: Intubation Performed by: Quinnetta Roepke Pre-anesthesia Checklist: Patient identified, Patient being monitored, Timeout performed, Emergency Drugs available and Suction available Patient Re-evaluated:Patient Re-evaluated prior to inductionOxygen Delivery Method: Circle system utilized Preoxygenation: Pre-oxygenation with 100% oxygen Intubation Type: IV induction Ventilation: Mask ventilation without difficulty Laryngoscope Size: Miller and 2 Grade View: Grade I Tube type: Oral Tube size: 7.0 mm Number of attempts: 1 Placement Confirmation: ETT inserted through vocal cords under direct vision,  positive ETCO2 and breath sounds checked- equal and bilateral Secured at: 21 cm Tube secured with: Tape Dental Injury: Teeth and Oropharynx as per pre-operative assessment        

## 2016-09-23 NOTE — Transfer of Care (Signed)
Immediate Anesthesia Transfer of Care Note  Patient: Shannon Brady  Procedure(s) Performed: Procedure(s): LAPAROSCOPIC BILATERAL TUBAL LIGATION (Bilateral)  Patient Location: PACU  Anesthesia Type:General  Level of Consciousness: awake and alert   Airway & Oxygen Therapy: Patient Spontanous Breathing and Patient connected to face mask oxygen  Post-op Assessment: Report given to RN and Post -op Vital signs reviewed and stable  Post vital signs: Reviewed  Last Vitals:  Vitals:   09/23/16 0608 09/23/16 0910  BP: 121/77 124/88  Pulse: 81 97  Resp: 16 15  Temp: 36.9 C (!) 35.9 C    Last Pain:  Vitals:   09/23/16 0608  TempSrc: Oral         Complications: No apparent anesthesia complications

## 2016-09-23 NOTE — Discharge Instructions (Signed)
AMBULATORY SURGERY  DISCHARGE INSTRUCTIONS   1) The drugs that you were given will stay in your system until tomorrow so for the next 24 hours you should not:  A) Drive an automobile B) Make any legal decisions C) Drink any alcoholic beverage   2) You may resume regular meals tomorrow.  Today it is better to start with liquids and gradually work up to solid foods.  You may eat anything you prefer, but it is better to start with liquids, then soup and crackers, and gradually work up to solid foods.   3) Please notify your doctor immediately if you have any unusual bleeding, trouble breathing, redness and pain at the surgery site, drainage, fever, or pain not relieved by medication.    4) Additional Instructions:        Please contact your physician with any problems or Same Day Surgery at [phone], Monday through Friday 6 am to 4 pm, or [place] [facility] Main number at [phone].Laparoscopic Tubal Ligation, Care After Refer to this sheet in the next few weeks. These instructions provide you with information about caring for yourself after your procedure. Your health care provider may also give you more specific instructions. Your treatment has been planned according to current medical practices, but problems sometimes occur. Call your health care provider if you have any problems or questions after your procedure. WHAT TO EXPECT AFTER THE PROCEDURE After your procedure, it is common to have:  Sore throat.  Soreness at the incision site.  Mild cramping.  Tiredness.  Mild nausea or vomiting.  Shoulder pain. HOME CARE INSTRUCTIONS  Rest for the remainder of the day.  Take medicines only as directed by your health care provider. These include over-the-counter medicines and prescription medicines. Do not take aspirin, which can cause bleeding.  Over the next few days, gradually return to your normal activities and your normal diet.  Avoid sexual intercourse  for 2 weeks or as directed by your health care provider.  Do not use tampons, and do not douche.  Do not drive or operate heavy machinery while taking pain medicine.  Do not lift anything that is heavier than 5 lb (2.3 kg) for 2 weeks or as directed by your health care provider.  Do not take baths. Take showers only. Ask your health care provider when you can start taking baths.  Take your temperature twice each day and write it down.  Try to have help for your household needs for the first 7-10 days.  There are many different ways to close and cover an incision, including stitches (sutures), skin glue, and adhesive strips. Follow instructions from your health care provider about:  Incision care.  Bandage (dressing) changes and removal.  Incision closure removal.  Check your incision area every day for signs of infection. Watch for:  Redness, swelling, or pain.  Fluid, blood, or pus.  Keep all follow-up visits as directed by your health care provider. SEEK MEDICAL CARE IF:  You have redness, swelling, or increasing pain in your incision area.  You have fluid, blood, or pus coming from your incision for longer than 1 day.  You notice a bad smell coming from your incision or your dressing.  The edges of your incision break open after the sutures have been removed.  Your pain does not decrease after 2-3 days.  You have a rash.  You repeatedly become dizzy or light-headed.  You have a reaction to your medicine.  Your pain medicine is not  CARE IF:  You have a fever.  You faint.  You have increasing pain in your abdomen.  You have severe pain in one or both of your shoulders.  You have bleeding or drainage from your suture sites or your vagina after surgery.  You have shortness of breath or have difficulty breathing.  You have chest pain or leg pain.  You have ongoing nausea, vomiting, or diarrhea.   This information  is not intended to replace advice given to you by your health care provider. Make sure you discuss any questions you have with your health care provider.   Document Released: 06/28/2005 Document Revised: 04/25/2015 Document Reviewed: 03/21/2012 Elsevier Interactive Patient Education 2016 [employer].  

## 2016-09-23 NOTE — Anesthesia Postprocedure Evaluation (Signed)
Anesthesia Post Note  Patient: Shannon Brady  Procedure(s) Performed: Procedure(s) (LRB): LAPAROSCOPIC BILATERAL TUBAL LIGATION (Bilateral)  Patient location during evaluation: PACU Anesthesia Type: General Level of consciousness: awake and alert and oriented Pain management: pain level controlled Vital Signs Assessment: post-procedure vital signs reviewed and stable Respiratory status: spontaneous breathing, nonlabored ventilation and respiratory function stable Cardiovascular status: blood pressure returned to baseline and stable Postop Assessment: no signs of nausea or vomiting Anesthetic complications: no    Last Vitals:  Vitals:   09/23/16 0943 09/23/16 0949  BP:  114/73  Pulse: 82 80  Resp: 11 13  Temp: 36.2 C     Last Pain:  Vitals:   09/23/16 0940  TempSrc:   PainSc: 3                  Latesa Fratto

## 2016-10-01 ENCOUNTER — Ambulatory Visit (INDEPENDENT_AMBULATORY_CARE_PROVIDER_SITE_OTHER): Payer: Medicaid Other | Admitting: Obstetrics and Gynecology

## 2016-10-01 ENCOUNTER — Encounter: Payer: Self-pay | Admitting: Obstetrics and Gynecology

## 2016-10-01 VITALS — BP 103/69 | HR 70 | Ht 65.0 in | Wt 146.7 lb

## 2016-10-01 DIAGNOSIS — Z9889 Other specified postprocedural states: Secondary | ICD-10-CM

## 2016-10-01 DIAGNOSIS — N939 Abnormal uterine and vaginal bleeding, unspecified: Secondary | ICD-10-CM

## 2016-10-01 DIAGNOSIS — Z9851 Tubal ligation status: Secondary | ICD-10-CM

## 2016-10-01 MED ORDER — MEDROXYPROGESTERONE ACETATE 10 MG PO TABS
10.0000 mg | ORAL_TABLET | Freq: Every day | ORAL | 0 refills | Status: DC
Start: 2016-10-01 — End: 2017-04-03

## 2016-10-01 NOTE — Progress Notes (Signed)
   GYNECOLOGY POST-OPERATIVE PROGRESS NOTE Subjective:     Shannon Brady is a 25 y.o. [phone] female who presents to the clinic 1 weeks status post laparoscopic BTL for requested sterilization. Eating a regular diet without difficulty. Bowel movements are normal. Pain is controlled without any medications.   Patient does note that her bleeding since surgery has been gradually worsening.  Notes that it was light the first 1-2 days after surgery, however each day has become heavier with now passage of clots. In addition, notes increasing fatigue. Denies SOB, chest pain.  Unsure if this is the onset of menses, as she has had no bleeding since her delivery ~ 2 months ago as she is exclusively breastfeeding.    The following portions of the patient's history were reviewed and updated as appropriate: allergies, current medications, past family history, past medical history, past social history, past surgical history and problem list.  Review of Systems Pertinent items noted in HPI and remainder of comprehensive ROS otherwise negative.    Objective:    BP 103/69 (BP Location: Left Arm, Patient Position: Sitting, Cuff Size: Normal)   Pulse 70   Ht 5\' 5"  (1.651 m)   Wt 146 lb 11.2 oz (66.5 kg)   LMP  (LMP Unknown)   Breastfeeding? Yes   BMI 24.41 kg/m  General:  alert and no distress  Abdomen: soft, bowel sounds active, non-tender  Incision:   healing well, no drainage, no erythema, no hernia, no seroma, no swelling, no dehiscence, incision well approximated  Pelvis:  external genitalia normal, rectovaginal septum normal.  Vagina without discharge.  Moderate amount of dark red blood in vaginal vault. Cervix normal appearing, no lesions and no motion tenderness.  Uterus mobile, nontender, normal shape and size.  Adnexae non-palpable, nontender bilaterally.          Lab Results  Component Value Date   WBC 7.7 09/19/2016   HGB 13.6 09/19/2016   HCT 38.8 09/19/2016   MCV 89.2 09/19/2016   PLT 246 09/19/2016     Assessment:    Postoperative course complicated by uterine bleeding, possibly onset of heavy menses.    Plan:   1.Continue any current medications. 2. Wound care discussed. 3. Activity restrictions: none 4. Anticipated return to work: not applicable. 5. Operative findings reviewed.  6. Discussion had on management of heavy bleeding.  Will prescribe Provera 10 mg 10 days.  To f/u if bleeding does not lessen or resolve after use of medication.  7. Follow up: 4-6 months for annual exam, or sooner if needed.      Hildred LaserAnika Eduard Penkala, MD [facility]

## 2016-10-09 ENCOUNTER — Telehealth: Payer: Self-pay | Admitting: Obstetrics and Gynecology

## 2016-10-09 DIAGNOSIS — Z9189 Other specified personal risk factors, not elsewhere classified: Secondary | ICD-10-CM

## 2016-10-09 DIAGNOSIS — B370 Candidal stomatitis: Secondary | ICD-10-CM

## 2016-10-09 MED ORDER — FLUCONAZOLE 100 MG PO TABS: 100.0000 mg | ORAL_TABLET | Freq: Two times a day (BID) | ORAL | 0 refills | Status: AC

## 2016-10-09 NOTE — Telephone Encounter (Signed)
Patients twins have thrush. She needs to be treated as well since she is breastfeeding. She uses the [facility] in [place].Thanks

## 2016-10-09 NOTE — Telephone Encounter (Signed)
Per protocols pt rx diflucan 100mg  bid X 14 days.

## 2016-10-13 IMAGING — US US MFM OB FOLLOW-UP
1 series · 14 of 23 positions shown · non-contrast
Comparison: none

[Series 1: us mfm ob follow-up · 0.23mm/px · 14 of 23 slices shown]
[im 1/23]
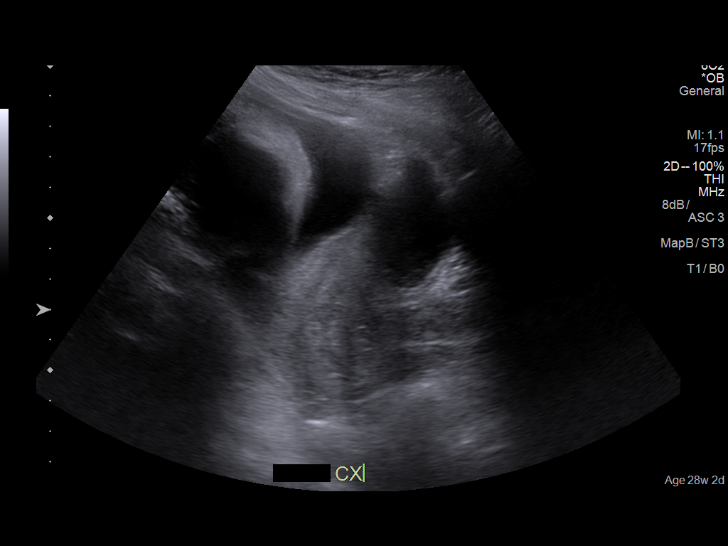
[im 3/23]
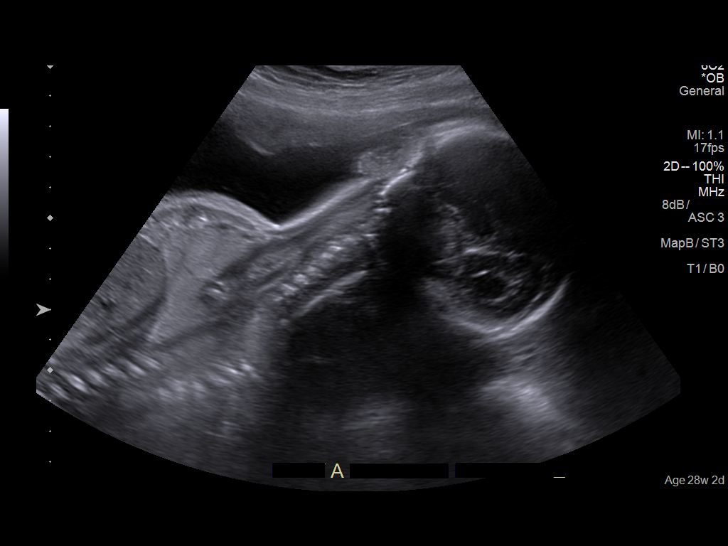
[im 5/23]
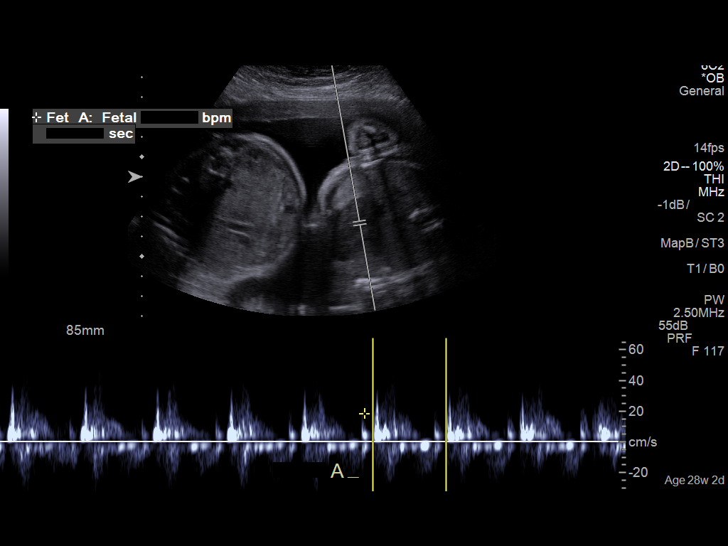
[im 6/23]
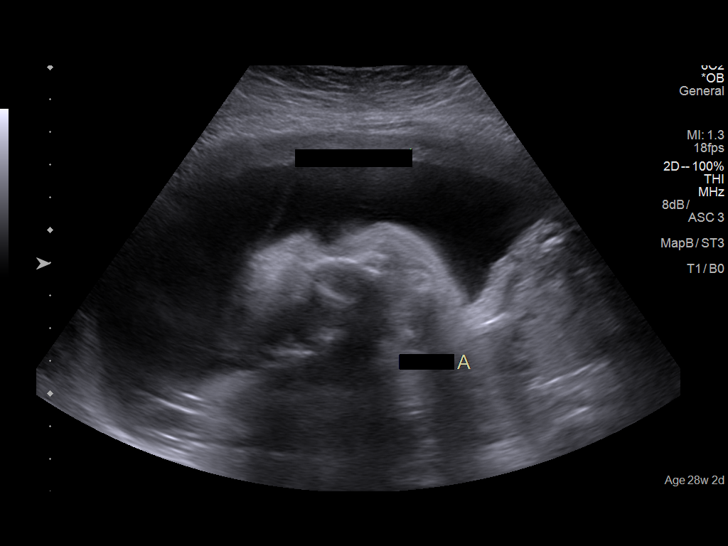
[im 8/23]
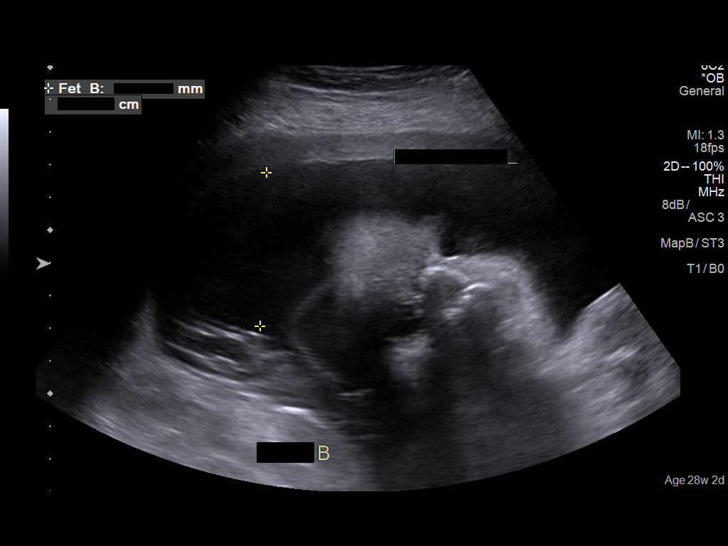
[im 10/23]
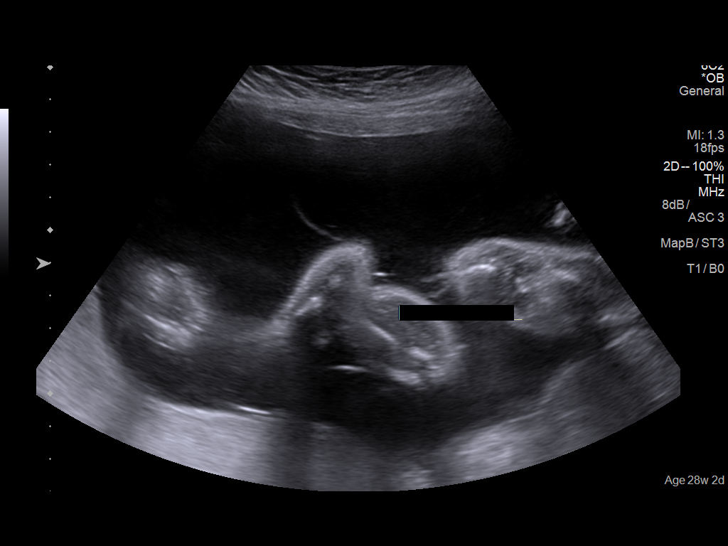
[im 11/23]
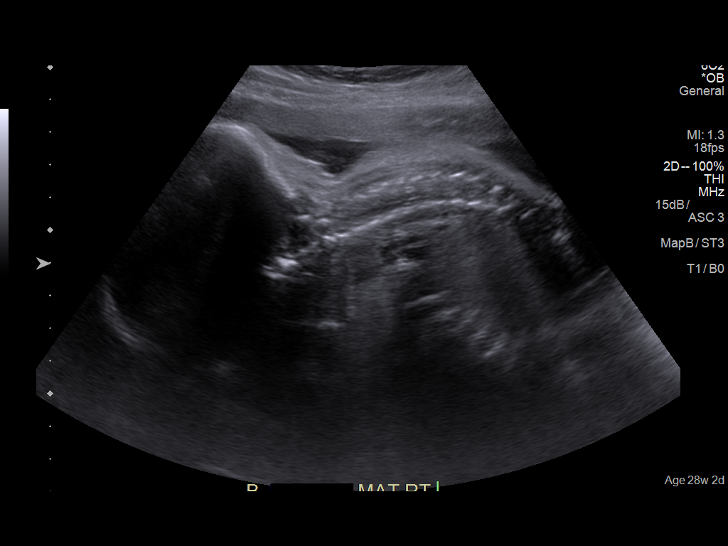
[im 13/23]
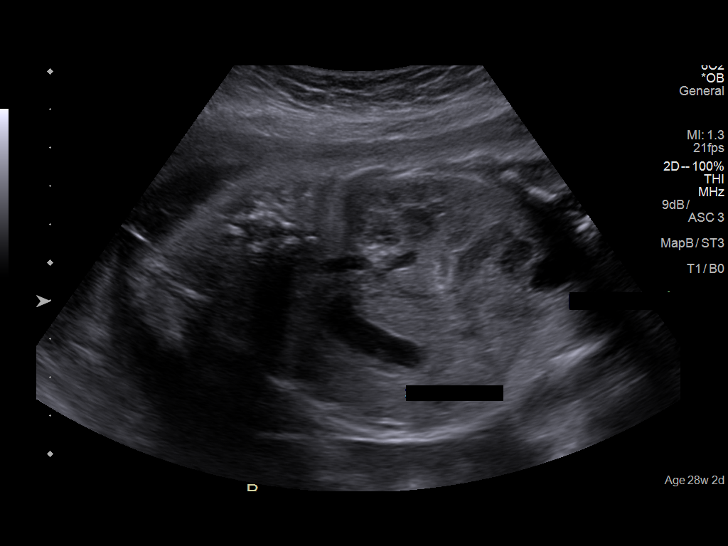
[im 14/23]
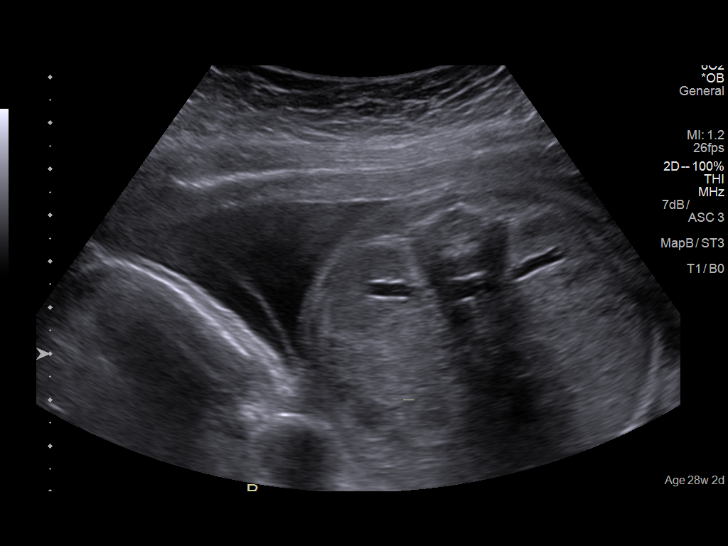
[im 16/23]
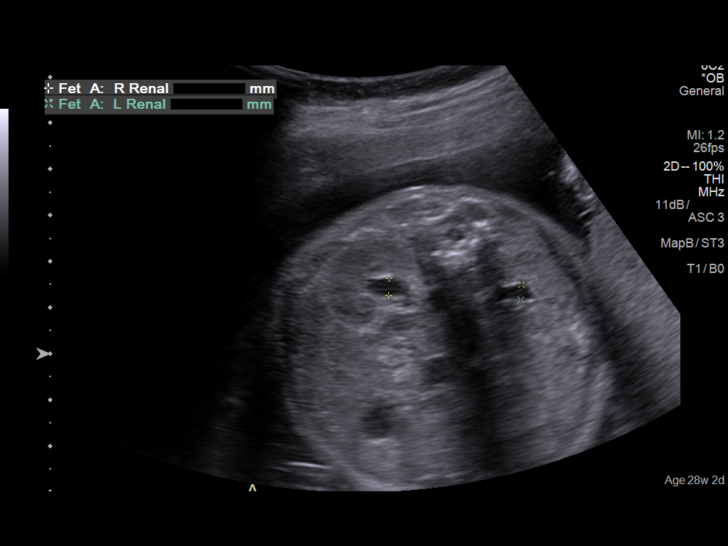
[im 18/23]
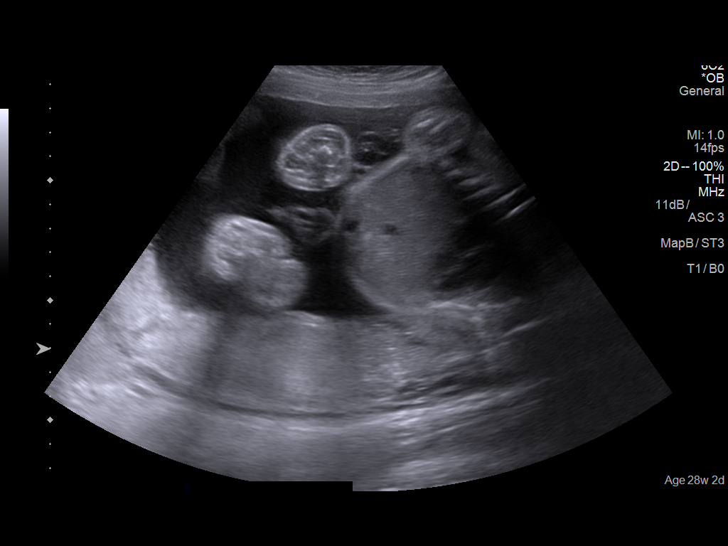
[im 19/23]
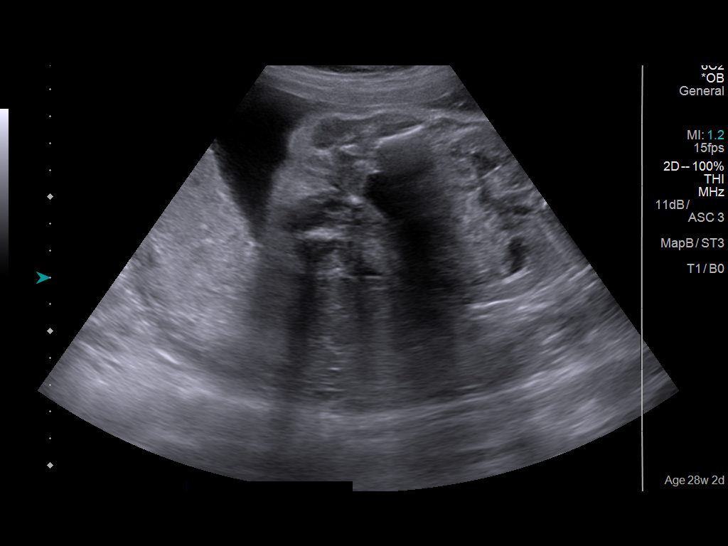
[im 21/23]
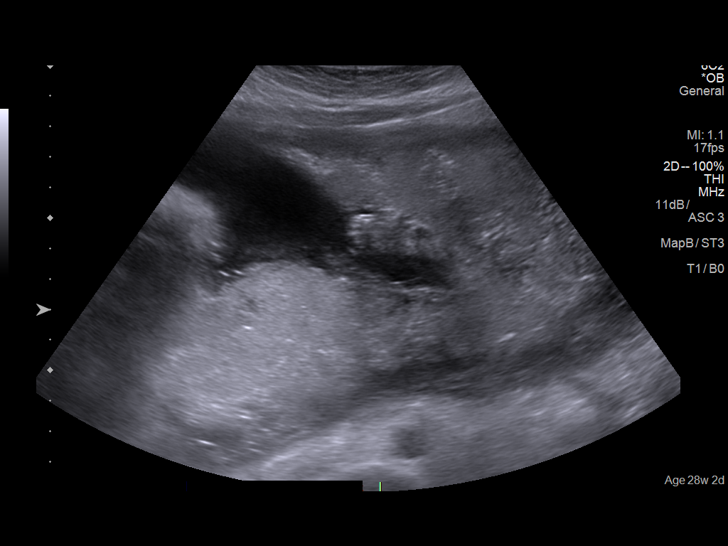
[im 23/23]
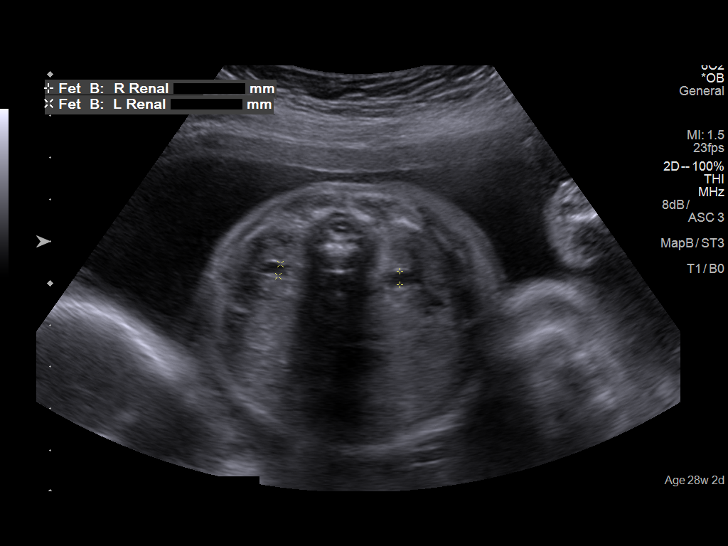

[14 of 23 positions shown; findings below may reference images not displayed]

Canned report from images found in remote index.

Refer to host system for actual result text.

## 2016-12-27 ENCOUNTER — Telehealth: Payer: Self-pay | Admitting: Obstetrics and Gynecology

## 2016-12-27 DIAGNOSIS — Z9189 Other specified personal risk factors, not elsewhere classified: Secondary | ICD-10-CM

## 2016-12-27 DIAGNOSIS — B370 Candidal stomatitis: Secondary | ICD-10-CM

## 2016-12-27 MED ORDER — FLUCONAZOLE 100 MG PO TABS: 100.0000 mg | ORAL_TABLET | Freq: Two times a day (BID) | ORAL | 0 refills | Status: DC

## 2016-12-27 NOTE — Telephone Encounter (Signed)
PT CALLED AND SHE HAS THRUSH ON BOTH NIPPLES FROM THE BABY HAVING THRUSH, THE BABY IS [place], SO SHE WAS WONDERING IF SOMETHING COULD BE CALLED IN FOR HER.

## 2016-12-27 NOTE — Telephone Encounter (Signed)
Per protocol rx sent in for diflucan 100mg  bid x 14 days. Also sent pt education via [employer]chart

## 2016-12-31 ENCOUNTER — Other Ambulatory Visit: Payer: Self-pay

## 2016-12-31 DIAGNOSIS — Z9189 Other specified personal risk factors, not elsewhere classified: Secondary | ICD-10-CM

## 2016-12-31 DIAGNOSIS — B370 Candidal stomatitis: Secondary | ICD-10-CM

## 2016-12-31 MED ORDER — FLUCONAZOLE 100 MG PO TABS: 100.0000 mg | ORAL_TABLET | Freq: Two times a day (BID) | ORAL | 1 refills | Status: AC

## 2017-01-13 ENCOUNTER — Telehealth: Payer: Self-pay | Admitting: Obstetrics and Gynecology

## 2017-01-13 NOTE — Telephone Encounter (Signed)
PT CALLED AND HAD A TUBAL FROM YOU, SHE DOES HAVE MEDICAID WHICH IS COVERED AT 100% FOR STERILIZATION. SHE IS GETTING BILLS SO SHE CONTACTED MEDICAID AND THEY TOLD HER THAT EVERYTHING SHOULD BE COVERED BUT IT MIGHT BE WHAT THE HOSPITAL IS BILLING FOR. SHE CONTACTED THE BILLING DEPARTMENT AND THEY TOLD HER THAT THEY ARE CODING WHAT THE DOCTOR PUTS IN TO CONTACT US. SO SHE WANTED TO MAKE SURE THE [place] CODE FOR HER STERILIZATION ARE CORRECT SO THAT SHE WILL NOT KEEP GETTING BILLS AND MEDICAID WILL PAY FOR.

## 2017-01-20 NOTE — Telephone Encounter (Signed)
Ok.  I will forward this to [employer]Kristal Cox and see what is going on as she is the one who normally submits the charges.   Dr. Valentino Saxonherry

## 2017-04-03 ENCOUNTER — Encounter: Payer: Self-pay | Admitting: Obstetrics and Gynecology

## 2017-04-03 ENCOUNTER — Ambulatory Visit (INDEPENDENT_AMBULATORY_CARE_PROVIDER_SITE_OTHER): Payer: Medicaid Other | Admitting: Obstetrics and Gynecology

## 2017-04-03 VITALS — BP 114/61 | HR 71 | Ht 65.0 in | Wt 147.1 lb

## 2017-04-03 DIAGNOSIS — N920 Excessive and frequent menstruation with regular cycle: Secondary | ICD-10-CM

## 2017-04-03 DIAGNOSIS — Z0000 Encounter for general adult medical examination without abnormal findings: Secondary | ICD-10-CM | POA: Diagnosis not present

## 2017-04-03 DIAGNOSIS — Z01419 Encounter for gynecological examination (general) (routine) without abnormal findings: Secondary | ICD-10-CM

## 2017-04-03 NOTE — Progress Notes (Signed)
.    GYNECOLOGY ANNUAL PHYSICAL EXAM PROGRESS NOTE  Subjective:    Shannon Brady is a 26 y.o. [phone] female who presents for an annual exam. The patient has no complaints today. The patient is sexually active. The patient wears seatbelts: yes. The patient participates in regular exercise: no. Has the patient ever been transfused or tattooed?: no. The patient reports that there is not domestic violence in her life.    Patient notes that she is considering having cosmetic breast surgery (for enhancement).    Gynecologic History  Menarche age: 39.  Notes menstrual cycles have become a little heavier since tubal ligation performed.  Patient's last menstrual period was 03/07/2017. Contraception: tubal ligation History of STI's: Denies Last Pap: 2-3 years ago (cannot recall). Results were: normal.  Denies h/o abnormal pap smears.   Obstetric History   G3   P3   T2   P1   A0   L4    SAB0   TAB0   Ectopic0   Multiple1   Live Births4     # Outcome Date GA Lbr Len/2nd Weight Sex Delivery Anes PTL Lv  3A Preterm 07/11/16 [redacted]w[redacted]d  4 lb 12.5 oz (2.17 kg) M Vag-Spont None  LIV     Name: JACE, DOWE     Apgar1:  8                Apgar5: 9  3B Preterm 07/11/16 [redacted]w[redacted]d  4 lb 14 oz (2.21 kg) M Vag-Spont None  LIV     Name: Galli,BOYB Dazani     Apgar1:  7                Apgar5: 9  2 Term 03/24/13    F Vag-Spont   LIV  1 Term 09/16/10    F Vag-Spont   LIV      Past Medical History:  Diagnosis Date  . Medical history non-contributory     Past Surgical History:  Procedure Laterality Date  . NO PAST SURGERIES    . TUBAL LIGATION Bilateral 09/23/2016   Procedure: LAPAROSCOPIC BILATERAL TUBAL LIGATION;  Surgeon: Hildred Laser, MD;  Location: [facility] ORS;  Service: Gynecology;  Laterality: Bilateral;    Family History  Problem Relation Age of Onset  . Breast cancer Neg Hx   . Diabetes Neg Hx   . Coronary artery disease Neg Hx     Social History   Social History  . Marital  status: Married    Spouse name: N/A  . Number of children: N/A  . Years of education: N/A   Occupational History  . Not on file.   Social History Main Topics  . Smoking status: Never Smoker  . Smokeless tobacco: Never Used  . Alcohol use No  . Drug use: No  . Sexual activity: Yes    Birth control/ protection: Surgical   Other Topics Concern  . Not on file   Social History Narrative  . No narrative on file    No current outpatient prescriptions on file prior to visit.   No current facility-administered medications on file prior to visit.     Allergies  Allergen Reactions  . Penicillins Itching      Review of Systems Constitutional: negative for chills, fatigue, fevers and sweats Eyes: negative for irritation, redness and visual disturbance Ears, nose, mouth, throat, and face: negative for hearing loss, nasal congestion, snoring and tinnitus Respiratory: negative for asthma, cough, sputum Cardiovascular: negative for chest pain, dyspnea, exertional  chest pressure/discomfort, irregular heart beat, palpitations and syncope Gastrointestinal: negative for abdominal pain, change in bowel habits, nausea and vomiting Genitourinary: positive for heavier menstrual cycles since tubal ligation.  Negative for abnormal menstrual periods, genital lesions, sexual problems and vaginal discharge, dysuria and urinary incontinence Integument/breast: negative for breast lump, breast tenderness and nipple discharge Hematologic/lymphatic: negative for bleeding and easy bruising Musculoskeletal:negative for back pain and muscle weakness Neurological: negative for dizziness, headaches, vertigo and weakness Endocrine: negative for diabetic symptoms including polydipsia, polyuria and skin dryness Allergic/Immunologic: negative for hay fever and urticaria        Objective:  Blood pressure 114/61, pulse 71, height  (1.651 m), weight 147 lb 1.6 oz (66.7 kg), last menstrual period  03/07/2017, not currently breastfeeding. Body mass index is 24.48 kg/m.  General Appearance:    Alert, cooperative, no distress, appears stated age  Head:    Normocephalic, without obvious abnormality, atraumatic  Eyes:    PERRL, conjunctiva/corneas clear, EOM's intact, both eyes  Ears:    Normal external ear canals, both ears  Nose:   Nares normal, septum midline, mucosa normal, no drainage or sinus tenderness  Throat:   Lips, mucosa, and tongue normal; teeth and gums normal  Neck:   Supple, symmetrical, trachea midline, no adenopathy; thyroid: no enlargement/tenderness/nodules; no carotid bruit or JVD  Back:     Symmetric, no curvature, ROM normal, no CVA tenderness  Lungs:     Clear to auscultation bilaterally, respirations unlabored  Chest Wall:    No tenderness or deformity   Heart:    Regular rate and rhythm, S1 and S2 normal, no murmur, rub or gallop  Breast Exam:    No tenderness, masses, or nipple abnormality  Abdomen:     Soft, non-tender, bowel sounds active all four quadrants, no masses, no organomegaly.    Genitalia:    Pelvic:external genitalia normal, vagina without lesions, discharge, or tenderness, rectovaginal septum  normal. Cervix normal in appearance, no cervical motion tenderness, no adnexal masses or tenderness.  Uterus normal size, shape, mobile, regular contours, nontender.  Rectal:    Normal external sphincter.  No hemorrhoids appreciated. Internal exam not done.   Extremities:   Extremities normal, atraumatic, no cyanosis or edema  Pulses:   2+ and symmetric all extremities  Skin:   Skin color, texture, turgor normal, no rashes or lesions  Lymph nodes:   Cervical, supraclavicular, and axillary nodes normal  Neurologic:   CNII-XII intact, normal strength, sensation and reflexes throughout   .  Labs:  Lab Results  Component Value Date   WBC 7.7 09/19/2016   HGB 13.6 09/19/2016   HCT 38.8 09/19/2016   MCV 89.2 09/19/2016   PLT 246 09/19/2016    Lab Results    Component Value Date   CREATININE 0.65 07/06/2016   BUN 8 07/06/2016   NA 135 07/06/2016   K 4.3 07/06/2016   CL 106 07/06/2016   CO2 25 07/06/2016    Lab Results  Component Value Date   ALT 12 (L) 07/06/2016   AST 24 07/06/2016   ALKPHOS 201 (H) 07/06/2016   BILITOT 0.7 07/06/2016    No results found for: TSH   Assessment:    Healthy female exam.   Heavy menses  Plan:    Blood tests: None ordered.  Up to date. Breast self exam technique reviewed and patient encouraged to perform self-exam monthly. Contraception: tubal ligation. Discussed healthy lifestyle modifications. Heavy menses, patient notes it is still manageable, does not  desire any intervention at this time.  Pap smear performed today.   Follow up in 1 year.     Hildred Laser, MD [facility]

## 2017-04-03 NOTE — Patient Instructions (Signed)
Health Maintenance, Female Adopting a healthy lifestyle and getting preventive care can go a long way to promote health and wellness. Talk with your health care provider about what schedule of regular examinations is right for you. This is a good chance for you to check in with your provider about disease prevention and staying healthy. In between checkups, there are plenty of things you can do on your own. Experts have done a lot of research about which lifestyle changes and preventive measures are most likely to keep you healthy. Ask your health care provider for more information. Weight and diet Eat a healthy diet  Be sure to include plenty of vegetables, fruits, low-fat dairy products, and lean protein.  Do not eat a lot of foods high in solid fats, added sugars, or salt.  Get regular exercise. This is one of the most important things you can do for your health.  Most adults should exercise for at least 150 minutes each week. The exercise should increase your heart rate and make you sweat (moderate-intensity exercise).  Most adults should also do strengthening exercises at least twice a week. This is in addition to the moderate-intensity exercise. Maintain a healthy weight  Body mass index (BMI) is a measurement that can be used to identify possible weight problems. It estimates body fat based on height and weight. Your health care provider can help determine your BMI and help you achieve or maintain a healthy weight.  For females 76 years of age and older:  A BMI below 18.5 is considered underweight.  A BMI of 18.5 to 24.9 is normal.  A BMI of 25 to 29.9 is considered overweight.  A BMI of 30 and above is considered obese. Watch levels of cholesterol and blood lipids  You should start having your blood tested for lipids and cholesterol at 28 years of age, then have this test every 5 years.  You may need to have your cholesterol levels checked more often if:  Your lipid or  cholesterol levels are high.  You are older than 28 years of age.  You are at high risk for heart disease. Cancer screening Lung Cancer  Lung cancer screening is recommended for adults 64-42 years old who are at high risk for lung cancer because of a history of smoking.  A yearly low-dose CT scan of the lungs is recommended for people who:  Currently smoke.  Have quit within the past 15 years.  Have at least a 30-pack-year history of smoking. A pack year is smoking an average of one pack of cigarettes a day for 1 year.  Yearly screening should continue until it has been 15 years since you quit.  Yearly screening should stop if you develop a health problem that would prevent you from having lung cancer treatment. Breast Cancer  Practice breast self-awareness. This means understanding how your breasts normally appear and feel.  It also means doing regular breast self-exams. Let your health care provider know about any changes, no matter how small.  If you are in your 20s or 30s, you should have a clinical breast exam (CBE) by a health care provider every 1-3 years as part of a regular health exam.  If you are 34 or older, have a CBE every year. Also consider having a breast X-ray (mammogram) every year.  If you have a family history of breast cancer, talk to your health care provider about genetic screening.  If you are at high risk for breast cancer, talk  to your health care provider about having an MRI and a mammogram every year.  Breast cancer gene (BRCA) assessment is recommended for women who have family members with BRCA-related cancers. BRCA-related cancers include:  Breast.  Ovarian.  Tubal.  Peritoneal cancers.  Results of the assessment will determine the need for genetic counseling and BRCA1 and BRCA2 testing. Cervical Cancer  Your health care provider may recommend that you be screened regularly for cancer of the pelvic organs (ovaries, uterus, and vagina).  This screening involves a pelvic examination, including checking for microscopic changes to the surface of your cervix (Pap test). You may be encouraged to have this screening done every 3 years, beginning at age 24.  For women ages 66-65, health care providers may recommend pelvic exams and Pap testing every 3 years, or they may recommend the Pap and pelvic exam, combined with testing for human papilloma virus (HPV), every 5 years. Some types of HPV increase your risk of cervical cancer. Testing for HPV may also be done on women of any age with unclear Pap test results.  Other health care providers may not recommend any screening for nonpregnant women who are considered low risk for pelvic cancer and who do not have symptoms. Ask your health care provider if a screening pelvic exam is right for you.  If you have had past treatment for cervical cancer or a condition that could lead to cancer, you need Pap tests and screening for cancer for at least 20 years after your treatment. If Pap tests have been discontinued, your risk factors (such as having a new sexual partner) need to be reassessed to determine if screening should resume. Some women have medical problems that increase the chance of getting cervical cancer. In these cases, your health care provider may recommend more frequent screening and Pap tests. Colorectal Cancer  This type of cancer can be detected and often prevented.  Routine colorectal cancer screening usually begins at 28 years of age and continues through 28 years of age.  Your health care provider may recommend screening at an earlier age if you have risk factors for colon cancer.  Your health care provider may also recommend using home test kits to check for hidden blood in the stool.  A small camera at the end of a tube can be used to examine your colon directly (sigmoidoscopy or colonoscopy). This is done to check for the earliest forms of colorectal cancer.  Routine  screening usually begins at age 41.  Direct examination of the colon should be repeated every 5-10 years through 28 years of age. However, you may need to be screened more often if early forms of precancerous polyps or small growths are found. Skin Cancer  Check your skin from head to toe regularly.  Tell your health care provider about any new moles or changes in moles, especially if there is a change in a mole's shape or color.  Also tell your health care provider if you have a mole that is larger than the size of a pencil eraser.  Always use sunscreen. Apply sunscreen liberally and repeatedly throughout the day.  Protect yourself by wearing long sleeves, pants, a wide-brimmed hat, and sunglasses whenever you are outside. Heart disease, diabetes, and high blood pressure  High blood pressure causes heart disease and increases the risk of stroke. High blood pressure is more likely to develop in:  People who have blood pressure in the high end of the normal range (130-139/85-89 mm Hg).  People who are overweight or obese.  People who are African American.  If you are 59-24 years of age, have your blood pressure checked every 3-5 years. If you are 34 years of age or older, have your blood pressure checked every year. You should have your blood pressure measured twice-once when you are at a hospital or clinic, and once when you are not at a hospital or clinic. Record the average of the two measurements. To check your blood pressure when you are not at a hospital or clinic, you can use:  An automated blood pressure machine at a pharmacy.  A home blood pressure monitor.  If you are between 29 years and 60 years old, ask your health care provider if you should take aspirin to prevent strokes.  Have regular diabetes screenings. This involves taking a blood sample to check your fasting blood sugar level.  If you are at a normal weight and have a low risk for diabetes, have this test once  every three years after 28 years of age.  If you are overweight and have a high risk for diabetes, consider being tested at a younger age or more often. Preventing infection Hepatitis B  If you have a higher risk for hepatitis B, you should be screened for this virus. You are considered at high risk for hepatitis B if:  You were born in a country where hepatitis B is common. Ask your health care provider which countries are considered high risk.  Your parents were born in a high-risk country, and you have not been immunized against hepatitis B (hepatitis B vaccine).  You have HIV or AIDS.  You use needles to inject street drugs.  You live with someone who has hepatitis B.  You have had sex with someone who has hepatitis B.  You get hemodialysis treatment.  You take certain medicines for conditions, including cancer, organ transplantation, and autoimmune conditions. Hepatitis C  Blood testing is recommended for:  Everyone born from [birth year] through 1967.  Anyone with known risk factors for hepatitis C. Sexually transmitted infections (STIs)  You should be screened for sexually transmitted infections (STIs) including gonorrhea and chlamydia if:  You are sexually active and are younger than 28 years of age.  You are older than 28 years of age and your health care provider tells you that you are at risk for this type of infection.  Your sexual activity has changed since you were last screened and you are at an increased risk for chlamydia or gonorrhea. Ask your health care provider if you are at risk.  If you do not have HIV, but are at risk, it may be recommended that you take a prescription medicine daily to prevent HIV infection. This is called pre-exposure prophylaxis (PrEP). You are considered at risk if:  You are sexually active and do not regularly use condoms or know the HIV status of your partner(s).  You take drugs by injection.  You are sexually active with a partner  who has HIV. Talk with your health care provider about whether you are at high risk of being infected with HIV. If you choose to begin PrEP, you should first be tested for HIV. You should then be tested every 3 months for as long as you are taking PrEP. Pregnancy  If you are premenopausal and you may become pregnant, ask your health care provider about preconception counseling.  If you may become pregnant, take 400 to 800 micrograms (mcg) of folic acid  every day.  If you want to prevent pregnancy, talk to your health care provider about birth control (contraception). Osteoporosis and menopause  Osteoporosis is a disease in which the bones lose minerals and strength with aging. This can result in serious bone fractures. Your risk for osteoporosis can be identified using a bone density scan.  If you are 4 years of age or older, or if you are at risk for osteoporosis and fractures, ask your health care provider if you should be screened.  Ask your health care provider whether you should take a calcium or vitamin D supplement to lower your risk for osteoporosis.  Menopause may have certain physical symptoms and risks.  Hormone replacement therapy may reduce some of these symptoms and risks. Talk to your health care provider about whether hormone replacement therapy is right for you. Follow these instructions at home:  Schedule regular health, dental, and eye exams.  Stay current with your immunizations.  Do not use any tobacco products including cigarettes, chewing tobacco, or electronic cigarettes.  If you are pregnant, do not drink alcohol.  If you are breastfeeding, limit how much and how often you drink alcohol.  Limit alcohol intake to no more than 1 drink per day for nonpregnant women. One drink equals 12 ounces of beer, 5 ounces of wine, or 1 ounces of hard liquor.  Do not use street drugs.  Do not share needles.  Ask your health care provider for help if you need support  or information about quitting drugs.  Tell your health care provider if you often feel depressed.  Tell your health care provider if you have ever been abused or do not feel safe at home. This information is not intended to replace advice given to you by your health care provider. Make sure you discuss any questions you have with your health care provider. Document Released: 06/24/2011 Document Revised: 05/16/2016 Document Reviewed: 09/12/2015 Elsevier Interactive Patient Education  2017 [employer].

## 2017-04-07 ENCOUNTER — Encounter: Payer: Self-pay | Admitting: Obstetrics and Gynecology

## 2017-04-07 DIAGNOSIS — N920 Excessive and frequent menstruation with regular cycle: Secondary | ICD-10-CM | POA: Insufficient documentation

## 2020-04-05 NOTE — Progress Notes (Signed)
Pt present for annual exam. Pt stated that she was doing well no problems.  

## 2020-04-05 NOTE — Patient Instructions (Addendum)
Preventive Care 21-31 Years Old, Female Preventive care refers to visits with your health care provider and lifestyle choices that can promote health and wellness. This includes:  A yearly physical exam. This may also be called an annual well check.  Regular dental visits and eye exams.  Immunizations.  Screening for certain conditions.  Healthy lifestyle choices, such as eating a healthy diet, getting regular exercise, not using drugs or products that contain nicotine and tobacco, and limiting alcohol use. What can I expect for my preventive care visit? Physical exam Your health care provider will check your:  Height and weight. This may be used to calculate body mass index (BMI), which tells if you are at a healthy weight.  Heart rate and blood pressure.  Skin for abnormal spots. Counseling Your health care provider may ask you questions about your:  Alcohol, tobacco, and drug use.  Emotional well-being.  Home and relationship well-being.  Sexual activity.  Eating habits.  Work and work environment.  Method of birth control.  Menstrual cycle.  Pregnancy history. What immunizations do I need?  Influenza (flu) vaccine  This is recommended every year. Tetanus, diphtheria, and pertussis (Tdap) vaccine  You may need a Td booster every 10 years. Varicella (chickenpox) vaccine  You may need this if you have not been vaccinated. Human papillomavirus (HPV) vaccine  If recommended by your health care provider, you may need three doses over 6 months. Measles, mumps, and rubella (MMR) vaccine  You may need at least one dose of MMR. You may also need a second dose. Meningococcal conjugate (MenACWY) vaccine  One dose is recommended if you are age 19-21 years and a first-year college student living in a residence hall, or if you have one of several medical conditions. You may also need additional booster doses. Pneumococcal conjugate (PCV13) vaccine  You may need  this if you have certain conditions and were not previously vaccinated. Pneumococcal polysaccharide (PPSV23) vaccine  You may need one or two doses if you smoke cigarettes or if you have certain conditions. Hepatitis A vaccine  You may need this if you have certain conditions or if you travel or work in places where you may be exposed to hepatitis A. Hepatitis B vaccine  You may need this if you have certain conditions or if you travel or work in places where you may be exposed to hepatitis B. Haemophilus influenzae type b (Hib) vaccine  You may need this if you have certain conditions. You may receive vaccines as individual doses or as more than one vaccine together in one shot (combination vaccines). Talk with your health care provider about the risks and benefits of combination vaccines. What tests do I need?  Blood tests  Lipid and cholesterol levels. These may be checked every 5 years starting at age 20.  Hepatitis C test.  Hepatitis B test. Screening  Diabetes screening. This is done by checking your blood sugar (glucose) after you have not eaten for a while (fasting).  Sexually transmitted disease (STD) testing.  BRCA-related cancer screening. This may be done if you have a family history of breast, ovarian, tubal, or peritoneal cancers.  Pelvic exam and Pap test. This may be done every 3 years starting at age 21. Starting at age 30, this may be done every 5 years if you have a Pap test in combination with an HPV test. Talk with your health care provider about your test results, treatment options, and if necessary, the need for more tests.   Follow these instructions at home: Eating and drinking   Eat a diet that includes fresh fruits and vegetables, whole grains, lean protein, and low-fat dairy.  Take vitamin and mineral supplements as recommended by your health care provider.  Do not drink alcohol if: ? Your health care provider tells you not to drink. ? You are  pregnant, may be pregnant, or are planning to become pregnant.  If you drink alcohol: ? Limit how much you have to 0-1 drink a day. ? Be aware of how much alcohol is in your drink. In the [place] one drink equals one 12 oz bottle of beer (355 mL), one 5 oz glass of wine (148 mL), or one 1 oz glass of hard liquor (44 mL). Lifestyle  Take daily care of your teeth and gums.  Stay active. Exercise for at least 30 minutes on 5 or more days each week.  Do not use any products that contain nicotine or tobacco, such as cigarettes, e-cigarettes, and chewing tobacco. If you need help quitting, ask your health care provider.  If you are sexually active, practice safe sex. Use a condom or other form of birth control (contraception) in order to prevent pregnancy and STIs (sexually transmitted infections). If you plan to become pregnant, see your health care provider for a preconception visit. What's next?  Visit your health care provider once a year for a well check visit.  Ask your health care provider how often you should have your eyes and teeth checked.  Stay up to date on all vaccines. This information is not intended to replace advice given to you by your health care provider. Make sure you discuss any questions you have with your health care provider. Document Revised: 08/20/2018 Document Reviewed: 08/20/2018 Elsevier Patient Education  2020 [employer] Breast Self-Awareness Breast self-awareness is knowing how your breasts look and feel. Doing breast self-awareness is important. It allows you to catch a breast problem early while it is still small and can be treated. All women should do breast self-awareness, including women who have had breast implants. Tell your doctor if you notice a change in your breasts. What you need:  A mirror.  A well-lit room. How to do a breast self-exam A breast self-exam is one way to learn what is normal for your breasts and to check for changes. To do a  breast self-exam: Look for changes  1. Take off all the clothes above your waist. 2. Stand in front of a mirror in a room with good lighting. 3. Put your hands on your hips. 4. Push your hands down. 5. Look at your breasts and nipples in the mirror to see if one breast or nipple looks different from the other. Check to see if: ? The shape of one breast is different. ? The size of one breast is different. ? There are wrinkles, dips, and bumps in one breast and not the other. 6. Look at each breast for changes in the skin, such as: ? Redness. ? Scaly areas. 7. Look for changes in your nipples, such as: ? Liquid around the nipples. ? Bleeding. ? Dimpling. ? Redness. ? A change in where the nipples are. Feel for changes  1. Lie on your back on the floor. 2. Feel each breast. To do this, follow these steps: ? Pick a breast to feel. ? Put the arm closest to that breast above your head. ? Use your other arm to feel the nipple area of your breast. Feel   the area with the pads of your three middle fingers by making small circles with your fingers. For the first circle, press lightly. For the second circle, press harder. For the third circle, press even harder. ? Keep making circles with your fingers at the different pressures as you move down your breast. Stop when you feel your ribs. ? Move your fingers a little toward the center of your body. ? Start making circles with your fingers again, this time going up until you reach your collarbone. ? Keep making up-and-down circles until you reach your armpit. Remember to keep using the three pressures. ? Feel the other breast in the same way. 3. Sit or stand in the tub or shower. 4. With soapy water on your skin, feel each breast the same way you did in step 2 when you were lying on the floor. Write down what you find Writing down what you find can help you remember what to tell your doctor. Write down:  What is normal for each breast.  Any  changes you find in each breast, including: ? The kind of changes you find. ? Whether you have pain. ? Size and location of any lumps.  When you last had your menstrual period. General tips  Check your breasts every month.  If you are breastfeeding, the best time to check your breasts is after you feed your baby or after you use a breast pump.  If you get menstrual periods, the best time to check your breasts is 5-7 days after your menstrual period is over.  With time, you will become comfortable with the self-exam, and you will begin to know if there are changes in your breasts. Contact a doctor if you:  See a change in the shape or size of your breasts or nipples.  See a change in the skin of your breast or nipples, such as red or scaly skin.  Have fluid coming from your nipples that is not normal.  Find a lump or thick area that was not there before.  Have pain in your breasts.  Have any concerns about your breast health. Summary  Breast self-awareness includes looking for changes in your breasts, as well as feeling for changes within your breasts.  Breast self-awareness should be done in front of a mirror in a well-lit room.  You should check your breasts every month. If you get menstrual periods, the best time to check your breasts is 5-7 days after your menstrual period is over.  Let your doctor know of any changes you see in your breasts, including changes in size, changes on the skin, pain or tenderness, or fluid from your nipples that is not normal. This information is not intended to replace advice given to you by your health care provider. Make sure you discuss any questions you have with your health care provider. Document Revised: 07/28/2018 Document Reviewed: 07/28/2018 Elsevier Patient Education  [place].     Kegel Exercises  Kegel exercises can help strengthen your pelvic floor muscles. The pelvic floor is a group of muscles that support your  rectum, small intestine, and bladder. In females, pelvic floor muscles also help support the womb (uterus). These muscles help you control the flow of urine and stool. Kegel exercises are painless and simple, and they do not require any equipment. Your provider may suggest Kegel exercises to:  Improve bladder and bowel control.  Improve sexual response.  Improve weak pelvic floor muscles after surgery to remove the  uterus (hysterectomy) or pregnancy (females).  Improve weak pelvic floor muscles after prostate gland removal or surgery (males). Kegel exercises involve squeezing your pelvic floor muscles, which are the same muscles you squeeze when you try to stop the flow of urine or keep from passing gas. The exercises can be done while sitting, standing, or lying down, but it is best to vary your position. Exercises How to do Kegel exercises: 1. Squeeze your pelvic floor muscles tight. You should feel a tight lift in your rectal area. If you are a female, you should also feel a tightness in your vaginal area. Keep your stomach, buttocks, and legs relaxed. 2. Hold the muscles tight for up to 10 seconds. 3. Breathe normally. 4. Relax your muscles. 5. Repeat as told by your health care provider. Repeat this exercise daily as told by your health care provider. Continue to do this exercise for at least 4-6 weeks, or for as long as told by your health care provider. You may be referred to a physical therapist who can help you learn more about how to do Kegel exercises. Depending on your condition, your health care provider may recommend:  Varying how long you squeeze your muscles.  Doing several sets of exercises every day.  Doing exercises for several weeks.  Making Kegel exercises a part of your regular exercise routine. This information is not intended to replace advice given to you by your health care provider. Make sure you discuss any questions you have with your health care provider.  Document Revised: 07/29/2018 Document Reviewed: 07/29/2018 Elsevier Patient Education  [place].

## 2020-04-06 ENCOUNTER — Other Ambulatory Visit: Payer: Self-pay

## 2020-04-06 ENCOUNTER — Other Ambulatory Visit (HOSPITAL_COMMUNITY)
Admission: RE | Admit: 2020-04-06 | Discharge: 2020-04-06 | Disposition: A | Discharge status: Home / Self Care | Payer: Medicaid Other | Source: Ambulatory Visit | Attending: Obstetrics and Gynecology | Admitting: Obstetrics and Gynecology

## 2020-04-06 ENCOUNTER — Encounter: Payer: Self-pay | Admitting: Obstetrics and Gynecology

## 2020-04-06 ENCOUNTER — Ambulatory Visit (INDEPENDENT_AMBULATORY_CARE_PROVIDER_SITE_OTHER): Payer: Medicaid Other | Admitting: Obstetrics and Gynecology

## 2020-04-06 VITALS — BP 115/76 | HR 74 | Ht 61.0 in | Wt 147.0 lb

## 2020-04-06 DIAGNOSIS — R002 Palpitations: Secondary | ICD-10-CM

## 2020-04-06 DIAGNOSIS — Z124 Encounter for screening for malignant neoplasm of cervix: Secondary | ICD-10-CM | POA: Insufficient documentation

## 2020-04-06 DIAGNOSIS — Z01419 Encounter for gynecological examination (general) (routine) without abnormal findings: Secondary | ICD-10-CM | POA: Diagnosis present

## 2020-04-06 DIAGNOSIS — J392 Other diseases of pharynx: Secondary | ICD-10-CM

## 2020-04-06 DIAGNOSIS — Z13220 Encounter for screening for lipoid disorders: Secondary | ICD-10-CM

## 2020-04-06 DIAGNOSIS — Z8616 Personal history of COVID-19: Secondary | ICD-10-CM

## 2020-04-06 DIAGNOSIS — K649 Unspecified hemorrhoids: Secondary | ICD-10-CM

## 2020-04-06 DIAGNOSIS — N8189 Other female genital prolapse: Secondary | ICD-10-CM

## 2020-04-06 DIAGNOSIS — K644 Residual hemorrhoidal skin tags: Secondary | ICD-10-CM

## 2020-04-06 MED ORDER — HYDROCORTISONE ACETATE 25 MG RE SUPP: 25.0000 mg | Freq: Two times a day (BID) | RECTAL | 1 refills | Status: DC

## 2020-04-06 NOTE — Progress Notes (Signed)
.    GYNECOLOGY ANNUAL PHYSICAL EXAM PROGRESS NOTE  Subjective:    Shannon Brady is a 29 y.o. [phone] female who presents for an annual exam. The patient is sexually active. The patient wears seatbelts: yes. The patient participates in regular exercise: no. Has the patient ever been transfused or tattooed?: no. The patient reports that there is not domestic violence in her life.    The patient has the followingcomplaints today.  1. Complains of hemorrhoids.  Has a history of hemorrhoids for several years which didn't bother her, but now is becoming progressively worse (main symptoms are itching).  2. Complains of palpitations, currently undergoing evaluation by Cardiology.  Also has been feeling a lump in her neck for the past 6 months, feels when she swallows. Had recent thyroid labs done at [facility], overall normal (but free T4 mildly elevated on review).  3. History of COVID in January (both she and her husband). Now doing well, no residual symptoms.   Gynecologic History  Menarche age: 30.  Notes menstrual cycles have become a little heavier since tubal ligation performed.  Patient's last menstrual period was 03/27/2020. Contraception: tubal ligation History of STI's: Denies Last Pap: . Results were: normal.  Denies h/o abnormal pap smears.   OB History  Gravida Para Term Preterm AB Living  3 3 2 1  0 4  SAB TAB Ectopic Multiple Live Births  0 0 0 1 4    # Outcome Date GA Lbr Len/2nd Weight Sex Delivery Anes PTL Lv  3A Preterm 07/11/16 [redacted]w[redacted]d  4 lb 12.5 oz (2.17 kg) M Vag-Spont None  LIV     Name: KORTNIE, STOVALL     Apgar1: 8  Apgar5: 9  3B Preterm 07/11/16 [redacted]w[redacted]d  4 lb 14 oz (2.21 kg) M Vag-Spont None  LIV     Name: Mara,BOYB Anniemae     Apgar1: 7  Apgar5: 9  2 Term 03/24/13    F Vag-Spont   LIV  1 Term 09/16/10    F Vag-Spont   LIV    Past Medical History:  Diagnosis Date  . Medical history non-contributory     Past Surgical History:  Procedure Laterality Date    . no surgical history    . TUBAL LIGATION Bilateral 09/23/2016   Procedure: LAPAROSCOPIC BILATERAL TUBAL LIGATION;  Surgeon: 11/23/2016, MD;  Location: [facility] ORS;  Service: Gynecology;  Laterality: Bilateral;    Family History  Problem Relation Age of Onset  . Healthy Mother   . Healthy Father   . Breast cancer Neg Hx   . Diabetes Neg Hx   . Coronary artery disease Neg Hx     Social History   Socioeconomic History  . Marital status: Married    Spouse name: Not on file  . Number of children: Not on file  . Years of education: Not on file  . Highest education level: Not on file  Occupational History  . Not on file  Tobacco Use  . Smoking status: Never Smoker  . Smokeless tobacco: Never Used  Substance and Sexual Activity  . Alcohol use: No  . Drug use: No  . Sexual activity: Yes    Birth control/protection: Surgical    Comment: tubal   Other Topics Concern  . Not on file  Social History Narrative  . Not on file   Social Determinants of Health   Financial Resource Strain:   . Difficulty of Paying Living Expenses:   Food Insecurity:   .  Worried About Charity fundraiser in the Last Year:   . Arboriculturist in the Last Year:   Transportation Needs:   . Film/video editor (Medical):   Marland Kitchen Lack of Transportation (Non-Medical):   Physical Activity:   . Days of Exercise per Week:   . Minutes of Exercise per Session:   Stress:   . Feeling of Stress :   Social Connections:   . Frequency of Communication with Friends and Family:   . Frequency of Social Gatherings with Friends and Family:   . Attends Religious Services:   . Active Member of Clubs or Organizations:   . Attends Archivist Meetings:   Marland Kitchen Marital Status:   Intimate Partner Violence:   . Fear of Current or Ex-Partner:   . Emotionally Abused:   Marland Kitchen Physically Abused:   . Sexually Abused:     Current Outpatient Medications on File Prior to Visit  Medication Sig Dispense Refill  . Multiple  Vitamins-Calcium (ONE-A-DAY WOMENS PO) Take by mouth.     No current facility-administered medications on file prior to visit.    Allergies  Allergen Reactions  . Penicillins Itching      Review of Systems Constitutional: negative for chills, fatigue, fevers and sweats Eyes: negative for irritation, redness and visual disturbance Ears, nose, mouth, throat, and face: negative for hearing loss, nasal congestion, snoring and tinnitus. Positive for lump in neck (right side). Respiratory: negative for asthma, cough, sputum Cardiovascular: negative for chest pain, dyspnea, exertional chest pressure/discomfort, irregular heart beat, and syncope. Positive for palpitations  Gastrointestinal: negative for abdominal pain, change in bowel habits, nausea and vomiting. Positive for hemorrhoids.  Genitourinary: Negative for abnormal menstrual periods, genital lesions, sexual problems and vaginal discharge, dysuria and urinary incontinence. Positive for feeling more pressure in pelvis, "like something is heavy about to come out".  Integument/breast: negative for breast lump, breast tenderness and nipple discharge Hematologic/lymphatic: negative for bleeding and easy bruising Musculoskeletal:negative for back pain and muscle weakness Neurological: negative for dizziness, headaches, vertigo and weakness Endocrine: negative for diabetic symptoms including polydipsia, polyuria and skin dryness Allergic/Immunologic: negative for hay fever and urticaria        Objective:  Blood pressure 115/76, pulse 74, height 5\' 1"  (1.549 m), weight 147 lb (66.7 kg), last menstrual period 03/27/2020. Body mass index is 27.78 kg/m.  General Appearance:    Alert, cooperative, no distress, appears stated age, overweight  Head:    Normocephalic, without obvious abnormality, atraumatic  Eyes:    PERRL, conjunctiva/corneas clear, EOM's intact, both eyes  Ears:    Normal external ear canals, both ears  Nose:   Nares  normal, septum midline, mucosa normal, no drainage or sinus tenderness  Throat:   Lips, mucosa, and tongue normal; teeth and gums normal  Neck:   Supple, symmetrical, trachea midline, possible mildly enlarged lymph node vs parotid gland swelling on right; thyroid: no enlargement/tenderness/nodules; no carotid bruit or JVD  Back:     Symmetric, no curvature, ROM normal, no CVA tenderness  Lungs:     Clear to auscultation bilaterally, respirations unlabored  Chest Wall:    No tenderness or deformity   Heart:    Regular rate and rhythm, S1 and S2 normal, no murmur, rub or gallop  Breast Exam:    No tenderness, masses, or nipple abnormality  Abdomen:     Soft, non-tender, bowel sounds active all four quadrants, no masses, no organomegaly.    Genitalia:  Pelvic:external genitalia normal, vagina without lesions, discharge, or tenderness, rectovaginal septum  normal. Cervix normal in appearance, no cervical motion tenderness, no adnexal masses or tenderness.  Uterus normal size, shape, mobile, regular contours, nontender.  Rectal:    Normal external sphincter.  Small hemorrhoid appreciated. Anal skin tag present.  Internal exam not done.   Extremities:   Extremities normal, atraumatic, no cyanosis or edema  Pulses:   2+ and symmetric all extremities  Skin:   Skin color, texture, turgor normal, no rashes or lesions  Lymph nodes:   Cervical, supraclavicular, and axillary nodes normal  Neurologic:   CNII-XII intact, normal strength, sensation and reflexes throughout   .  Labs:  Labs reviewed in Care Everywhere (@ [facility], 02/29/2020)   Assessment:   1. Encounter for well woman exam with routine gynecological exam   2. Cervical cancer screening   3. Screening for lipid disorders   4. Anal skin tag   5. Palpitations   6. Throat mass   7. Pelvic floor relaxation   8. Hemorrhoids, unspecified hemorrhoid type   9. History of COVID-19      Plan:    - Blood tests: Lipid panel ordered. All other  labs up to date.  - Breast self exam technique reviewed and patient encouraged to perform self-exam monthly. - Contraception: tubal ligation. - Discussed healthy lifestyle modifications.  - Pap smear performed today. - Palpitations, currently being worked up by Cardiology. Is wearing Holter monitor today.  Has had negative workup so far, but still pending echocardiogram.  Is aware that this could also be residual from COVID.  - Hemorrhoids and anal skin tag, has used OTC options in the past. Will try Anusol, for symptoms, and patient desires referral to GI (in [facility]) for skin tag removal.  - Throat mass likely benign, not related to thyroid as it was not in appropriate location, and thyroid studies normal. Discussed possible enlargement of parotid gland vs lymphadenopathy as mass comes and goes. Patient notes if symptoms don't resolve she would like a referral to ENT for peace of mind.  - Patient with noted pelvic floor relaxation, uterine prolapse Grade 1-2, and Grade 1 rectocele. Advised on Kegel exercises, and if no improvement, should consider referral to pelvic floor physical therapy. Likely secondary to childbirth history (esepecially multiple gestation).  - Follow up in 1 year for annual exam.      Hildred Laser, MD [facility]

## 2020-04-07 LAB — LIPID PANEL
Chol/HDL Ratio: 2.5 ratio (ref 0.0–4.4)
Cholesterol, Total: 156 mg/dL (ref 100–199)
HDL: 63 mg/dL (ref >39)
LDL Chol Calc (NIH): 81 mg/dL (ref 0–99)
Triglycerides: 60 mg/dL (ref 0–149)
VLDL Cholesterol Cal: 12 mg/dL (ref 5–40)

## 2020-04-10 ENCOUNTER — Encounter: Payer: Self-pay | Admitting: Obstetrics and Gynecology

## 2020-04-10 DIAGNOSIS — Z8616 Personal history of COVID-19: Secondary | ICD-10-CM | POA: Insufficient documentation

## 2020-04-10 LAB — CYTOLOGY - PAP
Comment: NEGATIVE
Diagnosis: NEGATIVE
High risk HPV: NEGATIVE

## 2021-04-20 ENCOUNTER — Other Ambulatory Visit: Payer: Self-pay

## 2021-04-20 ENCOUNTER — Ambulatory Visit (INDEPENDENT_AMBULATORY_CARE_PROVIDER_SITE_OTHER): Payer: Self-pay | Admitting: Obstetrics and Gynecology

## 2021-04-20 ENCOUNTER — Encounter: Payer: Self-pay | Admitting: Obstetrics and Gynecology

## 2021-04-20 VITALS — BP 126/86 | HR 80 | Ht 61.0 in | Wt 166.0 lb

## 2021-04-20 DIAGNOSIS — N941 Unspecified dyspareunia: Secondary | ICD-10-CM

## 2021-04-20 DIAGNOSIS — R5383 Other fatigue: Secondary | ICD-10-CM

## 2021-04-20 DIAGNOSIS — N920 Excessive and frequent menstruation with regular cycle: Secondary | ICD-10-CM

## 2021-04-20 DIAGNOSIS — Z3202 Encounter for pregnancy test, result negative: Secondary | ICD-10-CM

## 2021-04-20 DIAGNOSIS — N923 Ovulation bleeding: Secondary | ICD-10-CM

## 2021-04-20 DIAGNOSIS — N644 Mastodynia: Secondary | ICD-10-CM

## 2021-04-20 LAB — POCT URINE PREGNANCY: Preg Test, Ur: NEGATIVE

## 2021-04-20 NOTE — Progress Notes (Signed)
Pt present today due to heavy bleeding and spotting between cycles. Pt stated starting her cycle around 03/27/2021 and around 04/03/2021 she started spotting everyday. Pt stated having heavy cycles with clots, sharp pain in the vaginal area, fatigue and breast/nipple tenderness/pain when she on her cycle and when she is not. UPT-NEG.

## 2021-04-20 NOTE — Patient Instructions (Signed)
Abnormal Uterine Bleeding  Abnormal uterine bleeding is unusual bleeding from the uterus. It includes bleeding after sex, or bleeding or spotting between menstrual periods. It may also include bleeding that is heavier than normal, menstrual periods that last longer than usual, or bleeding that occurs after menopause. Abnormal uterine bleeding can affect teenagers, women in their reproductive years, pregnant women, and women who have reached menopause. Common causes of abnormal uterine bleeding include:  Pregnancy.  Growths of tissue (polyps).  Benign tumors or growths in the uterus (fibroids). These are not cancer.  Infection.  Cancer.  Too much or too little of some hormones in the body (hormonal imbalances). Any type of abnormal bleeding should be checked by a health care provider. Many cases are minor and simple to treat, but others may be more serious. Treatment will depend on the cause and severity of the bleeding. Follow these instructions at home: Medicines  Take over-the-counter and prescription medicines only as told by your health care provider.  Tell your health care provider about other medicines that you take. You may be asked to stop taking aspirin or medicines that contain aspirin. These medicines can make bleeding worse.  If you were prescribed iron pills, take them as told by your health care provider. Iron pills help to replace iron that your body loses because of this condition. Managing constipation In cases of severe bleeding, you may be asked to increase your iron intake to treat anemia. This may cause constipation. To prevent or treat constipation, you may need to:  Drink enough fluid to keep your urine pale yellow.  Take over-the-counter or prescription medicines.  Eat foods that are high in fiber, such as beans, whole grains, and fresh fruits and vegetables.  Limit foods that are high in fat and processed sugars, such as fried or sweet foods. General  instructions  Monitor your condition for any changes.  Do not use tampons, douche, or have sex until your health care provider says these things are okay.  Change your pads often.  Get regular exams. This includes pelvic exams and cervical cancer screenings. ? It is up to you to get the results of any tests that are done. Ask your health care provider, or the department that is doing the tests, when your results will be ready.  Keep all follow-up visits as told by your health care provider. This is important. Contact a health care provider if you:  Have bleeding that lasts for more than 1 week.  Feel dizzy at times.  Feel nauseous or you vomit.  Feel light-headed or weak.  Notice any other changes that show that your condition is getting worse. Get help right away if you:  Pass out.  Have bleeding that soaks through a pad every hour.  Have pain in the abdomen.  Have a fever or chills.  Become sweaty or weak.  Pass large blood clots from your vagina. Summary  Abnormal uterine bleeding is unusual bleeding from the uterus.  Any type of abnormal bleeding should be evaluated by a health care provider. Many cases are minor and simple to treat, but others may be more serious.  Treatment will depend on the cause of the bleeding.  Get help right away if you pass out, you have bleeding that soaks through a pad every hour, or you pass large blood clots from your vagina. This information is not intended to replace advice given to you by your health care provider. Make sure you discuss any questions you   have with your health care provider. Document Revised: 08/16/2020 Document Reviewed: 10/12/2019 Elsevier Patient Education  2021 Elsevier Inc.  

## 2021-04-20 NOTE — Progress Notes (Signed)
    GYNECOLOGY PROGRESS NOTE  Subjective:    Patient ID: Shannon Brady, female    DOB: [birth year], 30 y.o.   MRN: [id number]  HPI  Patient is a 30 y.o. [id number] female who presents for complaints of breast tenderness, nipple pain, abnormal heavy cycles with clotting for the past several months, and most recent cycle with intermenstrual cycle.  Also has been noting fatigue, and sharp pain in the vaginal area and pelvic cramping.  She additionally is reporting some mild pain with intercourse. Feels that her partner is sometimes hitting her cervix.   Patient's last menstrual period was 03/27/2021.  Patient does have a history of prior tubal ligation.   The following portions of the patient's history were reviewed and updated as appropriate: allergies, current medications, past family history, past medical history, past social history, past surgical history and problem list.  Review of Systems Pertinent items noted in HPI and remainder of comprehensive ROS otherwise negative.   Objective:   Blood pressure 126/86, pulse 80, height 5\' 1"  (1.549 m), weight 166 lb (75.3 kg), last menstrual period 03/27/2021. General appearance: alert and no distress Abdomen: soft, non-tender; bowel sounds normal; no masses,  no organomegaly Pelvic: external genitalia normal, rectovaginal septum normal.  Vagina with small amount of dark red discharge.  Cervix normal appearing, no lesions and no motion tenderness.  Uterus mobile, nontender, normal shape and size.  Adnexae non-palpable, nontender bilaterally.  Extremities: extremities normal, atraumatic, no cyanosis or edema Neurologic: Grossly normal    Labs:  Results for orders placed or performed in visit on 04/20/21  POCT urine pregnancy  Result Value Ref Range   Preg Test, Ur Negative Negative     Assessment:   1. Menorrhagia with regular cycle   2. Fatigue, unspecified type   3. Intermenstrual spotting   4. Breast tenderness in female   5.  Dyspareunia in female     Plan:   1. Menorrhagia - Unclear cause.  Possibly hormonal as she is also noting breast tenderness and nipple pain. Normal physical exam, no concern for pelvic masses. Will check hormone levels. UPT performed due to patient's history, to rule out possibilitiy of failure of tubal ligation with possible tubal or uterine pregnancy, was negative.  Labs ordered to assess hormone levels. Discussed possible management options including hormonal regulation with birth control.   2. Fatigue - patient reports recent fatigue. Will check Hgb levels as she has been noting heavier cycles.  3. Intermenstrual bleeding - has been occurred once. Unclear cause. No evidence of vaginal infection. May be hormonal.  3. Breast tenderness - likely hormonal in nature.  4. Dyspareunia - unclear cause. No evidence of prolapse or genital infection. Discussed pelvic floor exercises.    04/22/21, MD [facility]

## 2021-04-22 ENCOUNTER — Encounter: Payer: Self-pay | Admitting: Obstetrics and Gynecology

## 2021-04-23 ENCOUNTER — Other Ambulatory Visit: Payer: Self-pay

## 2021-04-23 ENCOUNTER — Other Ambulatory Visit: Payer: Medicaid Other

## 2021-04-24 LAB — CBC
Hematocrit: 40.3 % (ref 34.0–46.6)
Hemoglobin: 13.3 g/dL (ref 11.1–15.9)
MCH: 29.1 pg (ref 26.6–33.0)
MCHC: 33 g/dL (ref 31.5–35.7)
MCV: 88 fL (ref 79–97)
Platelets: 335 10*3/uL (ref 150–450)
RBC: 4.57 x10E6/uL (ref 3.77–5.28)
RDW: 12 % (ref 11.7–15.4)
WBC: 7.2 10*3/uL (ref 3.4–10.8)

## 2021-04-24 LAB — PROGESTERONE: Progesterone: 0.3 ng/mL

## 2021-04-24 LAB — FSH/LH
FSH: 5.8 m[IU]/mL
LH: 5.6 m[IU]/mL

## 2021-04-24 LAB — TSH: TSH: 1.35 u[IU]/mL (ref 0.450–4.500)

## 2021-04-24 LAB — PROLACTIN: Prolactin: 8.3 ng/mL (ref 4.8–23.3)

## 2021-04-24 LAB — ESTRADIOL: Estradiol: 65.9 pg/mL

## 2021-06-26 NOTE — Patient Instructions (Signed)
Breast Self-Awareness Breast self-awareness is knowing how your breasts look and feel. Doing breast self-awareness is important. It allows you to catch a breast problem early while it is still small and can be treated. All women should do breast self-awareness, including women who have had breast implants. Tell your doctorif you notice a change in your breasts. What you need: A mirror. A well-lit room. How to do a breast self-exam A breast self-exam is one way to learn what is normal for your breasts and tocheck for changes. To do a breast self-exam: Look for changes  Take off all the clothes above your waist. Stand in front of a mirror in a room with good lighting. Put your hands on your hips. Push your hands down. Look at your breasts and nipples in the mirror to see if one breast or nipple looks different from the other. Check to see if: The shape of one breast is different. The size of one breast is different. There are wrinkles, dips, and bumps in one breast and not the other. Look at each breast for changes in the skin, such as: Redness. Scaly areas. Look for changes in your nipples, such as: Liquid around the nipples. Bleeding. Dimpling. Redness. A change in where the nipples are.  Feel for changes  Lie on your back on the floor. Feel each breast. To do this, follow these steps: Pick a breast to feel. Put the arm closest to that breast above your head. Use your other arm to feel the nipple area of your breast. Feel the area with the pads of your three middle fingers by making small circles with your fingers. For the first circle, press lightly. For the second circle, press harder. For the third circle, press even harder. Keep making circles with your fingers at the different pressures as you move down your breast. Stop when you feel your ribs. Move your fingers a little toward the center of your body. Start making circles with your fingers again, this time going up until  you reach your collarbone. Keep making up-and-down circles until you reach your armpit. Remember to keep using the three pressures. Feel the other breast in the same way. Sit or stand in the tub or shower. With soapy water on your skin, feel each breast the same way you did in step 2 when you were lying on the floor.  Write down what you find Writing down what you find can help you remember what to tell your doctor. Write down: What is normal for each breast. Any changes you find in each breast, including: The kind of changes you find. Whether you have pain. Size and location of any lumps. When you last had your menstrual period. General tips Check your breasts every month. If you are breastfeeding, the best time to check your breasts is after you feed your baby or after you use a breast pump. If you get menstrual periods, the best time to check your breasts is 5-7 days after your menstrual period is over. With time, you will become comfortable with the self-exam, and you will begin to know if there are changes in your breasts. Contact a doctor if you: See a change in the shape or size of your breasts or nipples. See a change in the skin of your breast or nipples, such as red or scaly skin. Have fluid coming from your nipples that is not normal. Find a lump or thick area that was not there before. Have pain in   your breasts. Have any concerns about your breast health. Summary Breast self-awareness includes looking for changes in your breasts, as well as feeling for changes within your breasts. Breast self-awareness should be done in front of a mirror in a well-lit room. You should check your breasts every month. If you get menstrual periods, the best time to check your breasts is 5-7 days after your menstrual period is over. Let your doctor know of any changes you see in your breasts, including changes in size, changes on the skin, pain or tenderness, or fluid from your nipples that is  not normal. This information is not intended to replace advice given to you by your health care provider. Make sure you discuss any questions you have with your healthcare provider. Document Revised: 07/28/2018 Document Reviewed: 07/28/2018 Elsevier Patient Education  2022 [employer] Preventive Care 21-39 Years Old, Female Preventive care refers to lifestyle choices and visits with your health care provider that can promote health and wellness. This includes: A yearly physical exam. This is also called an annual wellness visit. Regular dental and eye exams. Immunizations. Screening for certain conditions. Healthy lifestyle choices, such as: Eating a healthy diet. Getting regular exercise. Not using drugs or products that contain nicotine and tobacco. Limiting alcohol use. What can I expect for my preventive care visit? Physical exam Your health care provider may check your: Height and weight. These may be used to calculate your BMI (body mass index). BMI is a measurement that tells if you are at a healthy weight. Heart rate and blood pressure. Body temperature. Skin for abnormal spots. Counseling Your health care provider may ask you questions about your: Past medical problems. Family's medical history. Alcohol, tobacco, and drug use. Emotional well-being. Home life and relationship well-being. Sexual activity. Diet, exercise, and sleep habits. Work and work environment. Access to firearms. Method of birth control. Menstrual cycle. Pregnancy history. What immunizations do I need?  Vaccines are usually given at various ages, according to a schedule. Your health care provider will recommend vaccines for you based on your age, medicalhistory, and lifestyle or other factors, such as travel or where you work. What tests do I need?  Blood tests Lipid and cholesterol levels. These may be checked every 5 years starting at age 20. Hepatitis C test. Hepatitis B  test. Screening Diabetes screening. This is done by checking your blood sugar (glucose) after you have not eaten for a while (fasting). STD (sexually transmitted disease) testing, if you are at risk. BRCA-related cancer screening. This may be done if you have a family history of breast, ovarian, tubal, or peritoneal cancers. Pelvic exam and Pap test. This may be done every 3 years starting at age 21. Starting at age 30, this may be done every 5 years if you have a Pap test in combination with an HPV test. Talk with your health care provider about your test results, treatment options,and if necessary, the need for more tests. Follow these instructions at home: Eating and drinking  Eat a healthy diet that includes fresh fruits and vegetables, whole grains, lean protein, and low-fat dairy products. Take vitamin and mineral supplements as recommended by your health care provider. Do not drink alcohol if: Your health care provider tells you not to drink. You are pregnant, may be pregnant, or are planning to become pregnant. If you drink alcohol: Limit how much you have to 0-1 drink a day. Be aware of how much alcohol is in your drink. In the [place] one   drink equals one 12 oz bottle of beer (355 mL), one 5 oz glass of wine (148 mL), or one 1 oz glass of hard liquor (44 mL).  Lifestyle Take daily care of your teeth and gums. Brush your teeth every morning and night with fluoride toothpaste. Floss one time each day. Stay active. Exercise for at least 30 minutes 5 or more days each week. Do not use any products that contain nicotine or tobacco, such as cigarettes, e-cigarettes, and chewing tobacco. If you need help quitting, ask your health care provider. Do not use drugs. If you are sexually active, practice safe sex. Use a condom or other form of protection to prevent STIs (sexually transmitted infections). If you do not wish to become pregnant, use a form of birth control. If you plan to become  pregnant, see your health care provider for a prepregnancy visit. Find healthy ways to cope with stress, such as: Meditation, yoga, or listening to music. Journaling. Talking to a trusted person. Spending time with friends and family. Safety Always wear your seat belt while driving or riding in a vehicle. Do not drive: If you have been drinking alcohol. Do not ride with someone who has been drinking. When you are tired or distracted. While texting. Wear a helmet and other protective equipment during sports activities. If you have firearms in your house, make sure you follow all gun safety procedures. Seek help if you have been physically or sexually abused. What's next? Go to your health care provider once a year for an annual wellness visit. Ask your health care provider how often you should have your eyes and teeth checked. Stay up to date on all vaccines. This information is not intended to replace advice given to you by your health care provider. Make sure you discuss any questions you have with your healthcare provider. Document Revised: 08/06/2020 Document Reviewed: 08/20/2018 Elsevier Patient Education  2022 Elsevier Inc.  

## 2021-06-27 ENCOUNTER — Other Ambulatory Visit: Payer: Self-pay

## 2021-06-27 ENCOUNTER — Encounter: Payer: Self-pay | Admitting: Obstetrics and Gynecology

## 2021-06-27 ENCOUNTER — Ambulatory Visit (INDEPENDENT_AMBULATORY_CARE_PROVIDER_SITE_OTHER): Payer: Medicaid Other | Admitting: Obstetrics and Gynecology

## 2021-06-27 VITALS — BP 106/70 | HR 70 | Ht 61.0 in | Wt 168.9 lb

## 2021-06-27 DIAGNOSIS — R6889 Other general symptoms and signs: Secondary | ICD-10-CM | POA: Diagnosis not present

## 2021-06-27 DIAGNOSIS — N920 Excessive and frequent menstruation with regular cycle: Secondary | ICD-10-CM | POA: Diagnosis not present

## 2021-06-27 DIAGNOSIS — N644 Mastodynia: Secondary | ICD-10-CM

## 2021-06-27 DIAGNOSIS — Z01419 Encounter for gynecological examination (general) (routine) without abnormal findings: Secondary | ICD-10-CM

## 2021-06-27 NOTE — Progress Notes (Signed)
Pt present for annual exam. Pt c/o of left breast soreness.

## 2021-06-27 NOTE — Progress Notes (Signed)
.    GYNECOLOGY ANNUAL PHYSICAL EXAM PROGRESS NOTE  Subjective:    Shannon Brady is a 30 y.o. [phone] female who presents for an annual exam. The patient is sexually active. The patient wears seatbelts: yes. The patient participates in regular exercise: no. Has the patient ever been transfused or tattooed?: no. The patient reports that there is not domestic violence in her life.    The patient has the following complaints today.  1. Continues to note breast soreness.  Has been ongoing for ~ 1 year. Is using evening primrose oil.  2. Notes menstrual cycles have gotten better since the last time she was seen.  Still somewhat heavy but not as bad as before. Is using progesterone cream intermittently, and using an over the counter herbal supplement.  3. Still noting nodules in her throat. Desires referral to ENT.    Menstrual History: Menarche age: 30 Patient's last menstrual period was 06/07/2021. Period Duration (Days): 6 Period Pattern: Regular Menstrual Flow: Heavy, Moderate, Light Menstrual Control: Tampon, Maxi pad Menstrual Control Change Freq (Hours): 3-6 Dysmenorrhea: (!) Moderate Dysmenorrhea Symptoms: Cramping, Diarrhea  Gynecologic History  Menarche age: 30   Patient's last menstrual period was 06/07/2021. Contraception: tubal ligation History of STI's: Denies Last Pap: 04/06/2020. Results were: normal.  Denies h/o abnormal pap smears.   OB History  Gravida Para Term Preterm AB Living  3 3 2 1  0 4  SAB IAB Ectopic Multiple Live Births  0 0 0 1 4    # Outcome Date GA Lbr Len/2nd Weight Sex Delivery Anes PTL Lv  3A Preterm 07/11/16 [redacted]w[redacted]d  4 lb 12.5 oz (2.17 kg) M Vag-Spont None  LIV     Name: Markie,BOYA [place]     Apgar1: 8  Apgar5: 9  3B Preterm 07/11/16 [redacted]w[redacted]d  4 lb 14 oz (2.21 kg) M Vag-Spont None  LIV     Name: Bossard,BOYB Melvin     Apgar1: 7  Apgar5: 9  2 Term 03/24/13    F Vag-Spont   LIV  1 Term 09/16/10    F Vag-Spont   LIV    Past Medical  History:  Diagnosis Date   Medical history non-contributory     Past Surgical History:  Procedure Laterality Date   no surgical history     TUBAL LIGATION Bilateral 09/23/2016   Procedure: LAPAROSCOPIC BILATERAL TUBAL LIGATION;  Surgeon: 11/23/2016, MD;  Location: [facility] ORS;  Service: Gynecology;  Laterality: Bilateral;    Family History  Problem Relation Age of Onset   Healthy Mother    Heart attack Father    Breast cancer Neg Hx    Diabetes Neg Hx    Coronary artery disease Neg Hx     Social History   Socioeconomic History   Marital status: Married    Spouse name: Not on file   Number of children: Not on file   Years of education: Not on file   Highest education level: Not on file  Occupational History   Not on file  Tobacco Use   Smoking status: Never   Smokeless tobacco: Never  Vaping Use   Vaping Use: Never used  Substance and Sexual Activity   Alcohol use: No   Drug use: No   Sexual activity: Yes    Birth control/protection: Surgical    Comment: tubal   Other Topics Concern   Not on file  Social History Narrative   Not on file   Social Determinants of Health  Financial Resource Strain: Not on file  Food Insecurity: Not on file  Transportation Needs: Not on file  Physical Activity: Not on file  Stress: Not on file  Social Connections: Not on file  Intimate Partner Violence: Not on file    Current Outpatient Medications on File Prior to Visit  Medication Sig Dispense Refill   GNP EVENING PRIMROSE OIL PO Take by mouth.     Multiple Vitamins-Calcium (ONE-A-DAY WOMENS PO) Take by mouth.     NON FORMULARY Sprigs Hormonal health     OVER THE COUNTER MEDICATION Progesterone cream     No current facility-administered medications on file prior to visit.    Allergies  Allergen Reactions   Penicillins Itching      Review of Systems Constitutional: negative for chills, fatigue, fevers and sweats Eyes: negative for irritation, redness and visual  disturbance Ears, nose, mouth, throat, and face: negative for hearing loss, nasal congestion, snoring and tinnitus. Positive for lump in neck (right side). Respiratory: negative for asthma, cough, sputum Cardiovascular: negative for chest pain, dyspnea, exertional chest pressure/discomfort, irregular heart beat, and syncope.  Gastrointestinal: negative for abdominal pain, change in bowel habits, nausea and vomiting.  Genitourinary: Negative for abnormal menstrual periods, genital lesions, sexual problems and vaginal discharge, dysuria and urinary incontinence. Positive for heavy menstrual cycles (although improved since last year) Integument/breast: negative for breast lump,  and nipple discharge. Positive for breast tenderness Hematologic/lymphatic: negative for bleeding and easy bruising Musculoskeletal:negative for back pain and muscle weakness Neurological: negative for dizziness, headaches, vertigo and weakness Endocrine: negative for diabetic symptoms including polydipsia, polyuria and skin dryness Allergic/Immunologic: negative for hay fever and urticaria        Objective:  Blood pressure 106/70, pulse 70, height 5\' 1"  (1.549 m), weight 168 lb 14.4 oz (76.6 kg), last menstrual period 06/07/2021. Body mass index is 31.91 kg/m.  General Appearance:    Alert, cooperative, no distress, appears stated age, mild obesity  Head:    Normocephalic, without obvious abnormality, atraumatic  Eyes:    PERRL, conjunctiva/corneas clear, EOM's intact, both eyes  Ears:    Normal external ear canals, both ears  Nose:   Nares normal, septum midline, mucosa normal, no drainage or sinus tenderness  Throat:   Lips, mucosa, and tongue normal; teeth and gums normal  Neck:   Supple, symmetrical, trachea midline, possible mildly enlarged lymph node vs parotid gland swelling on right; thyroid: no enlargement/tenderness/nodules; no carotid bruit or JVD  Back:     Symmetric, no curvature, ROM normal, no CVA  tenderness  Lungs:     Clear to auscultation bilaterally, respirations unlabored  Chest Wall:    No tenderness or deformity   Heart:    Regular rate and rhythm, S1 and S2 normal, no murmur, rub or gallop  Breast Exam:    Mild tenderness in left breast, no masses, or nipple abnormality  Abdomen:     Soft, non-tender, bowel sounds active all four quadrants, no masses, no organomegaly.    Genitalia:    Pelvic:external genitalia normal, vagina without lesions, discharge, or tenderness, rectovaginal septum  normal. Cervix normal in appearance, no cervical motion tenderness, no adnexal masses or tenderness.  Uterus normal size, shape, mobile, regular contours, nontender.  Rectal:    Normal external sphincter.  Small hemorrhoid appreciated. Anal skin tag present.  Internal exam not done.   Extremities:   Extremities normal, atraumatic, no cyanosis or edema  Pulses:   2+ and symmetric all extremities  Skin:  Skin color, texture, turgor normal, no rashes or lesions  Lymph nodes:   Cervical, supraclavicular, and axillary nodes normal  Neurologic:   CNII-XII intact, normal strength, sensation and reflexes throughout   .  Labs:  Lab Results  Component Value Date   WBC 7.2 04/23/2021   HGB 13.3 04/23/2021   HCT 40.3 04/23/2021   MCV 88 04/23/2021   PLT 335 04/23/2021      Chemistry      Component Value Date/Time   NA 135 07/06/2016 0952   K 4.3 07/06/2016 0952   CL 106 07/06/2016 0952   CO2 25 07/06/2016 0952   BUN 8 07/06/2016 0952   CREATININE 0.65 07/06/2016 0952      Component Value Date/Time   CALCIUM 9.0 07/06/2016 0952   ALKPHOS 201 (H) 07/06/2016 0952   AST 24 07/06/2016 0952   ALT 12 (L) 07/06/2016 0952   BILITOT 0.7 07/06/2016 0952      Lab Results  Component Value Date   TSH 1.350 04/23/2021    Lab Results  Component Value Date   CHOL 156 04/06/2020   HDL 63 04/06/2020   LDLCALC 81 04/06/2020   TRIG 60 04/06/2020   CHOLHDL 2.5 04/06/2020    No results found  for: HGBA1C   Assessment:   1. Encounter for well woman exam with routine gynecological exam   2. Ear, nose, and throat symptom   3. Menorrhagia with regular cycle   4. Breast tenderness in female     Plan:    - Blood tests: Lipid panel ordered. All other labs up to date.  - Breast self exam technique reviewed and patient encouraged to perform self-exam monthly. - Contraception: tubal ligation. - Discussed healthy lifestyle modifications.  - Pap smear up to date.  - Throat mass likely benign, not related to thyroid as it was not in appropriate location, and thyroid studies normal. Discussed again possible enlargement of parotid gland vs lymphadenopathy as mass comes and goes. Patient notes she would like a referral to ENT for peace of mind as it has been going on for ~ 1 year. Also discussed possibility of allergies, encouraged use of an antihistamine daily.  Prefers referral location of [facility] as she is able to get charity care.  - Heavy menses, not as bad with use of supplements.  - Breast tenderness improving some, but still present, feels sharp at times. Also worsens just before onset of menses. Continue to advise on NSAIDs, hormonal supplementation, use of evening primrose oil. - COVID vaccination status: declines - Follow up in 1 year for annual exam.      Hildred Laser, MD [facility]

## 2022-07-03 ENCOUNTER — Ambulatory Visit (INDEPENDENT_AMBULATORY_CARE_PROVIDER_SITE_OTHER): Payer: 59 | Admitting: Obstetrics and Gynecology

## 2022-07-03 ENCOUNTER — Encounter: Payer: Self-pay | Admitting: Obstetrics and Gynecology

## 2022-07-03 VITALS — BP 123/76 | HR 69 | Resp 16 | Ht 61.0 in | Wt 169.2 lb

## 2022-07-03 DIAGNOSIS — Z01419 Encounter for gynecological examination (general) (routine) without abnormal findings: Secondary | ICD-10-CM | POA: Diagnosis not present

## 2022-07-03 DIAGNOSIS — N8189 Other female genital prolapse: Secondary | ICD-10-CM

## 2022-07-03 DIAGNOSIS — Z01411 Encounter for gynecological examination (general) (routine) with abnormal findings: Secondary | ICD-10-CM | POA: Diagnosis not present

## 2022-07-03 NOTE — Progress Notes (Signed)
GYNECOLOGY ANNUAL PHYSICAL EXAM PROGRESS NOTE  Subjective:    Shannon Brady is a 31 y.o. [phone] female who presents for an annual exam. The patient has no complaints today. The patient is sexually active. The patient participates in regular exercise: yes. Has the patient ever been transfused or tattooed?: no. The patient reports that there is not domestic violence in her life.   Reports that some of her ENT issues have improved with use of antihistamines.  Also notes that her menstrual cycles have gotten better of this year.   Menstrual History: Menarche age: 22 Patient's last menstrual period was 06/20/2022.   Period Cycle (Days): 24 Period Duration (Days): 7 Period Pattern: Regular Menstrual Flow: Heavy Menstrual Control: Maxi pad, Other (Comment) Menstrual Control Change Freq (Hours): 2-3 Dysmenorrhea: None   Gynecologic History:  Contraception: tubal ligation History of STI's: Denies Last Pap: 04/06/2020. Results were: normal.  Denies h/o abnormal pap smears.    OB History  Gravida Para Term Preterm AB Living  3 3 2 1  0 4  SAB IAB Ectopic Multiple Live Births  0 0 0 1 4    # Outcome Date GA Lbr Len/2nd Weight Sex Delivery Anes PTL Lv  3A Preterm 07/11/16 [redacted]w[redacted]d  4 lb 12.5 oz (2.17 kg) M Vag-Spont None  LIV     Name: Rominger,BOYA Sumire     Apgar1: 8  Apgar5: 9  3B Preterm 07/11/16 [redacted]w[redacted]d  4 lb 14 oz (2.21 kg) M Vag-Spont None  LIV     Name: Sharpless,BOYB Nayda     Apgar1: 7  Apgar5: 9  2 Term 03/24/13    F Vag-Spont   LIV  1 Term 09/16/10    F Vag-Spont   LIV    Past Medical History:  Diagnosis Date   Medical history non-contributory     Past Surgical History:  Procedure Laterality Date   no surgical history     TUBAL LIGATION Bilateral 09/23/2016   Procedure: LAPAROSCOPIC BILATERAL TUBAL LIGATION;  Surgeon: 11/23/2016, MD;  Location: [facility];  Service: Gynecology;  Laterality: Bilateral;    Family History  Problem Relation Age of Onset   Healthy  Mother    Heart attack Father    Breast cancer Neg Hx    Diabetes Neg Hx    Coronary artery disease Neg Hx     Social History   Socioeconomic History   Marital status: Married    Spouse name: Not on file   Number of children: Not on file   Years of education: Not on file   Highest education level: Not on file  Occupational History   Not on file  Tobacco Use   Smoking status: Never   Smokeless tobacco: Never  Vaping Use   Vaping Use: Never used  Substance and Sexual Activity   Alcohol use: No   Drug use: No   Sexual activity: Yes    Birth control/protection: Surgical    Comment: tubal   Other Topics Concern   Not on file  Social History Narrative   Not on file   Social Determinants of Health   Financial Resource Strain: Not on file  Food Insecurity: Not on file  Transportation Needs: Not on file  Physical Activity: Not on file  Stress: Not on file  Social Connections: Not on file  Intimate Partner Violence: Not on file    Current Outpatient Medications on File Prior to Visit  Medication Sig Dispense Refill   GNP EVENING PRIMROSE  OIL PO Take by mouth.     Multiple Vitamins-Calcium (ONE-A-DAY WOMENS PO) Take by mouth.     NON FORMULARY Sprigs Hormonal health     OVER THE COUNTER MEDICATION Progesterone cream     No current facility-administered medications on file prior to visit.    Allergies  Allergen Reactions   Penicillins Itching     Review of Systems Constitutional: negative for chills, fatigue, fevers and sweats Eyes: negative for irritation, redness and visual disturbance Ears, nose, mouth, throat, and face: negative for hearing loss, nasal congestion, snoring and tinnitus Respiratory: negative for asthma, cough, sputum Cardiovascular: negative for chest pain, dyspnea, exertional chest pressure/discomfort, irregular heart beat, palpitations and syncope Gastrointestinal: negative for abdominal pain, change in bowel habits, nausea and  vomiting Genitourinary: negative for abnormal menstrual periods, genital lesions, sexual problems and vaginal discharge, dysuria and urinary incontinence. Does report that se feels a fullness in her vagina, also sometimes difficult to have a bowel movement or intercourse due to feeling like something is down there.  Integument/breast: negative for breast lump, breast tenderness and nipple discharge Hematologic/lymphatic: negative for bleeding and easy bruising Musculoskeletal:negative for back pain and muscle weakness Neurological: negative for dizziness, headaches, vertigo and weakness Endocrine: negative for diabetic symptoms including polydipsia, polyuria and skin dryness Allergic/Immunologic: negative for hay fever and urticaria      Objective:  Blood pressure 123/76, pulse 69, resp. rate 16, height 5\' 1"  (1.549 m), weight 169 lb 3.2 oz (76.7 kg), last menstrual period 06/20/2022.  Body mass index is 31.97 kg/m.   General Appearance:    Alert, cooperative, no distress, appears stated age  Head:    Normocephalic, without obvious abnormality, atraumatic  Eyes:    PERRL, conjunctiva/corneas clear, EOM's intact, both eyes  Ears:    Normal external ear canals, both ears  Nose:   Nares normal, septum midline, mucosa normal, no drainage or sinus tenderness  Throat:   Lips, mucosa, and tongue normal; teeth and gums normal  Neck:   Supple, symmetrical, trachea midline, no adenopathy; thyroid: no enlargement/tenderness/nodules; no carotid bruit or JVD  Back:     Symmetric, no curvature, ROM normal, no CVA tenderness  Lungs:     Clear to auscultation bilaterally, respirations unlabored  Chest Wall:    No tenderness or deformity   Heart:    Regular rate and rhythm, S1 and S2 normal, no murmur, rub or gallop  Breast Exam:    No tenderness, masses, or nipple abnormality  Abdomen:     Soft, non-tender, bowel sounds active all four quadrants, no masses, no organomegaly.    Genitalia:     Pelvic:external genitalia normal, vagina without lesions, discharge, or tenderness, rectovaginal septum  normal. Mild cystocele present. Cervix normal in appearance, no cervical motion tenderness, no adnexal masses or tenderness.  Uterus normal size, shape, mobile, regular contours, nontender.  Rectal:    Normal external sphincter.  No hemorrhoids appreciated. Internal exam not done.   Extremities:   Extremities normal, atraumatic, no cyanosis or edema  Pulses:   2+ and symmetric all extremities  Skin:   Skin color, texture, turgor normal, no rashes or lesions  Lymph nodes:   Cervical, supraclavicular, and axillary nodes normal  Neurologic:   CNII-XII intact, normal strength, sensation and reflexes throughout   .  Labs:  Lab Results  Component Value Date   WBC 7.2 04/23/2021   HGB 13.3 04/23/2021   HCT 40.3 04/23/2021   MCV 88 04/23/2021   PLT 335 04/23/2021  Lab Results  Component Value Date   CREATININE 0.65 07/06/2016   BUN 8 07/06/2016   NA 135 07/06/2016   K 4.3 07/06/2016   CL 106 07/06/2016   CO2 25 07/06/2016    Lab Results  Component Value Date   ALT 12 (L) 07/06/2016   AST 24 07/06/2016   ALKPHOS 201 (H) 07/06/2016   BILITOT 0.7 07/06/2016    Lab Results  Component Value Date   TSH 1.350 04/23/2021     Assessment:   1. Encounter for well woman exam with routine gynecological exam   2. Pelvic floor relaxation      Plan:  Blood tests: CBC with diff and Comprehensive metabolic panel. Breast self exam technique reviewed and patient encouraged to perform self-exam monthly. Contraception: tubal ligation. Discussed healthy lifestyle modifications. Mammogram  : Not age appropriate Pap smear  UTD . COVID vaccination status: declines Referral placed to pelvic floor PT for pelvic floor relaxation.  Follow up in 1 year for annual exam   Hildred Laser, MD [facility]

## 2022-07-03 NOTE — Patient Instructions (Addendum)

## 2022-07-04 LAB — COMPREHENSIVE METABOLIC PANEL
ALT: 9 IU/L (ref 0–32)
AST: 11 IU/L (ref 0–40)
Albumin/Globulin Ratio: 2.4 — ABNORMAL HIGH (ref 1.2–2.2)
Albumin: 4.8 g/dL (ref 3.9–4.9)
Alkaline Phosphatase: 63 IU/L (ref 44–121)
BUN/Creatinine Ratio: 14 (ref 9–23)
BUN: 9 mg/dL (ref 6–20)
Bilirubin Total: 0.4 mg/dL (ref 0.0–1.2)
CO2: 24 mmol/L (ref 20–29)
Calcium: 9.6 mg/dL (ref 8.7–10.2)
Chloride: 103 mmol/L (ref 96–106)
Creatinine, Ser: 0.65 mg/dL (ref 0.57–1.00)
Globulin, Total: 2 g/dL (ref 1.5–4.5)
Glucose: 81 mg/dL (ref 70–99)
Potassium: 4.4 mmol/L (ref 3.5–5.2)
Sodium: 141 mmol/L (ref 134–144)
Total Protein: 6.8 g/dL (ref 6.0–8.5)
eGFR: 120 mL/min/{1.73_m2} (ref >59)

## 2022-07-04 LAB — CBC
Hematocrit: 42.8 % (ref 34.0–46.6)
Hemoglobin: 14.2 g/dL (ref 11.1–15.9)
MCH: 29 pg (ref 26.6–33.0)
MCHC: 33.2 g/dL (ref 31.5–35.7)
MCV: 87 fL (ref 79–97)
Platelets: 356 10*3/uL (ref 150–450)
RBC: 4.9 x10E6/uL (ref 3.77–5.28)
RDW: 11.9 % (ref 11.7–15.4)
WBC: 7.7 10*3/uL (ref 3.4–10.8)

## 2022-07-22 ENCOUNTER — Ambulatory Visit: Payer: 59 | Attending: Obstetrics and Gynecology

## 2022-07-22 DIAGNOSIS — N8189 Other female genital prolapse: Secondary | ICD-10-CM | POA: Insufficient documentation

## 2022-07-22 DIAGNOSIS — M6289 Other specified disorders of muscle: Secondary | ICD-10-CM | POA: Diagnosis not present

## 2022-07-22 DIAGNOSIS — R278 Other lack of coordination: Secondary | ICD-10-CM | POA: Diagnosis not present

## 2022-07-22 DIAGNOSIS — M6281 Muscle weakness (generalized): Secondary | ICD-10-CM | POA: Insufficient documentation

## 2022-07-22 NOTE — Therapy (Signed)
OUTPATIENT PHYSICAL THERAPY FEMALE PELVIC EVALUATION   Patient Name: Shannon Brady MRN: [id number] DOB:21-Oct-1991, 31 y.o., female Today's Date: 07/22/2022   PT End of Session - 07/22/22 1607     Visit Number 1    Number of Visits 12    Date for PT Re-Evaluation 10/14/22    Authorization Type IE: 07/22/22    PT Start Time 0410    PT Stop Time 0450    PT Time Calculation (min) 40 min    Activity Tolerance Patient tolerated treatment well             Past Medical History:  Diagnosis Date   Medical history non-contributory    Past Surgical History:  Procedure Laterality Date   no surgical history     TUBAL LIGATION Bilateral 09/23/2016   Procedure: LAPAROSCOPIC BILATERAL TUBAL LIGATION;  Surgeon: Hildred Laser, MD;  Location: [facility];  Service: Gynecology;  Laterality: Bilateral;   Patient Active Problem List   Diagnosis Date Noted   History of COVID-19 04/10/2020   Heavy menses 04/07/2017    PCP: None per Pt  REFERRING PROVIDER: Hildred Laser, MD   REFERRING DIAG:  N81.89 (ICD-10-CM) - Pelvic floor relaxation   THERAPY DIAG:  Pelvic floor dysfunction  Other lack of coordination  Muscle weakness (generalized)  Rationale for Evaluation and Treatment: Rehabilitation  ONSET DATE: 5 years ago   RED FLAGS: N/A Have you had any night sweats? Unexplained weight loss? Saddle anesthesia? Unexplained changes in bowel or bladder habits?   SUBJECTIVE: Patient confirms identification and approves PT to assess pelvic floor and treatment Yes                                                                                                                                                                                           PRECAUTIONS: None  WEIGHT BEARING RESTRICTIONS: No  FALLS:  Has patient fallen in last 6 months? No  OCCUPATION/SOCIAL ACTIVITIES: Staying at home with the kids, home school, physical activity with the kids, squatting  PLOF:  Independent   CHIEF CONCERN: Pt reports since having her twin boys, her pelvic floor has not felt the same. Things feel "saggy" and going to have a BM Pt has to use her finger between anus/vaginal opening to feel like everything has emptied. During penetrative sex, Pt has to switch positions in order to be comfortable and feel like "it" is back in the right place. Pt also has urinary leakage when jumping on the trampoline, vigorous coughing (if sick), and laughing.     PAIN:  Are you having pain? No   LIVING ENVIRONMENT: Lives with: lives  with their family Lives in: House/apartment   PATIENT GOALS: Pt would like to not feel the pressure at the vaginal opening and work on her toileting posture, working on stress incontinence     UROLOGICAL HISTORY Pain with urination: No Fully empty bladder: No Stream: inconsistent  Urgency: No Nocturia:  Toileting posture:  Leakage: Urge to void, Coughing, Sneezing, Laughing, Exercise, and Lifting Pads: No   GASTROINTESTINAL HISTORY Pt has no concerns  Pain with bowel movement: No Fully empty rectum: Yes    SEXUAL HISTORY/FUNCTION Ability to have vaginal penetration: Yes; Deep thrusting: Yes Able to achieve orgasm?: Yes  OBSTETRICAL HISTORY Vaginal deliveries: G3P4 Tearing: No Currently pregnant: No  GYNECOLOGICAL HISTORY Hysterectomy: tubal ligation Pelvic Organ Prolapse: Cystocele not graded - "mild cystocele present" per 07/03/22, Hildred Laser, MD  Heaviness/pressure: yes   OBJECTIVE:   DIAGNOSTIC TESTING/IMPRESSIONS:  Per Hildred Laser, MD 07/03/22 -Mild cystocele present    COGNITION: Overall cognitive status: Within functional limits for tasks assessed     POSTURE: 07/22/22  Iliac crest height: L higher  Pelvic obliquity: WNL   SENSATION: Deferred 2/2 time constraints  Light touch: , L2-S2 dermatomes  Proprioception:    RANGE OF MOTION:  Deferred the rest 2/2 time constraints   (Norm range in degrees)   LEFT  RIGHT   Lumbar forward flexion (65):  WNL    Lumbar extension (30): WNL    Lumbar lateral flexion (25):  WNL WNL  Thoracic and Lumbar rotation (30 degrees):    WNL WNL  Hip Flexion (0-125):      Hip IR (0-45):     Hip ER (0-45):     Hip Adduction:      Hip Abduction (0-40):     Hip extension (0-15):     (*= pain, Blank rows = not tested)   STRENGTH: MMT  Deferred 2/2 time constraints   RLE  LLE   Hip Flexion    Hip Extension    Hip Abduction     Hip Adduction     Hip ER     Hip IR     Knee Extension    Knee Flexion    Dorsiflexion     Plantarflexion (seated)    (*= pain, Blank rows = not tested)   SPECIAL TESTS: Deferred 2/2 time constraints  Centralization and Peripheralization (SN 92, -LR 0.12):  Slump (SN 83, -LR 0.32):  SLR (SN 92, -LR 0.29): R: Lumbar quadrant (SN 70): R:  FABER (SN 81): FADIR (SN 94):  Hip scour (SN 50):  Thigh Thrust (SN 88, -LR 0.18) : Distraction (IW97):  Compression (SN/SP 69): Stork/March (SP 93):   PHYSICAL PERFORMANCE MEASURES: Deferred 2/2 time constraints   STS:  RLE SLS:  LLE SLS:  6 MWT:  :  5TSTS:   PALPATION: Deferred 2/2 time constraints  Abdominal:  Diastasis:  finger above umbilicus,  fingers at and below umbilicus  Scar mobility: present/mobile perpendicular, parallel Rib flare: present/absent  EXTERNAL PELVIC EXAM: Patient educated on the purpose of the pelvic exam and articulated understanding; patient consented to the exam verbally. Deferred 2/2 time constraints  Palpation: Breath coordination: present/absent/inconsistent Voluntary Contraction: present/absent Relaxation: full/delayed/non-relaxing Perineal movement with sustained IAP increase ("bear down"): descent/no change/elevation/excessive descent Perineal movement with rapid IAP increase ("cough"): elevation/no change/descent Pubic symphysis: (0= no contraction, 1= flicker, 2= weak squeeze, 3= fair squeeze with lift, 4= good squeeze and  lift against resistance, 5= strong squeeze against strong resistance)   INTERNAL PELVIC EXAM: Patient educated on  the purpose of the pelvic exam and articulated understanding; patient consented to the exam verbally. Deferred 2/2 time constraints  Introitus Appears:  Skin integrity:  Scar mobility: Strength (PERF):  Symmetry: Palpation: Prolapse: (0= no contraction, 1= flicker, 2= weak squeeze, 3= fair squeeze with lift, 4= good squeeze and lift against resistance, 5= strong squeeze against strong resistance)   TODAY'S EVAL: posture assessed   Therapeutic Exercise: Brief discussion on squatting and incorporating pursed-lip breathing for improved IAP management as Pt does perform squats as a part of LE strengthening. Being more aware of Valsalva maneuver throughout the day.   Patient Education:  Patient educated on what to expect during course of physical therapy, POC, and provided with HEP including: toileting posture handout. Patient verbalized understanding and returned demonstration. Patient will benefit from further education in order to maximize compliance and understanding for long-term therapeutic gains.   Patient Surveys:  FOTO Urinary Problem - 75     ASSESSMENT:  Clinical Impression: Patient is a 31 y.o. who was seen today for physical therapy evaluation and treatment for a chief concern of organ prolapse. Today's evaluation suggest deficits in IAP management, PFM coordination, PFM strength, PFM endurance, and posture, as evidenced by a mild cystocele present, increased pressure/heaviness at the end of the day, having to use pushing technique at perineum in order to fully empty bowels, urinary leakage with vigorous coughing/laughing/jumping, and increased L iliac crest height (physical assessment). Patient's responses on FOTO Urinary Problem (75) indicates moderate limitation/disability/distress. Patient's progress may be limited due to time since onset; however, patient's  motivation is advantageous. Pt with basic understanding of PFM function in bowel/bladder habits, sexual function, posture, and the deep core. Patient will benefit from skilled therapeutic intervention to address deficits in IAP management, PFM coordination, PFM strength, PFM endurance, and posture in order to increase PLOF and improve overall QOL.    Objective Impairments: decreased coordination, decreased endurance, decreased strength, improper body mechanics, and postural dysfunction.   Activity Limitations: lifting, bending, squatting, continence, toileting, locomotion level, and caring for others  Personal Factors: Behavior pattern, Past/current experiences, and Time since onset of injury/illness/exacerbation are also affecting patient's functional outcome.   Rehab Potential: Good  Clinical Decision Making: Evolving/moderate complexity  Evaluation Complexity: Moderate   GOALS: Goals reviewed with patient? Yes  SHORT TERM GOALS: Target date: 09/02/2022  Patient will demonstrate independent and coordinated diaphragmatic breathing in supine with a 1:2 breathing pattern for improved down-regulation of the nervous system and improved management of intra-abdominal pressures in order to increase function at home and in the community. Baseline: will assess next visit  Goal status: INITIAL    LONG TERM GOALS: Target date: 10/14/2022   Patient will score  >/= 78 on FOTO Urinary Problem  in order to demonstrate improved IAP management, improved PFM coordination, and overall improved QOL.  Baseline: 75 Goal status: INITIAL  2. Patient will be able to articulate and demonstrate 3-5 postures that encourage gravity assisted repositioning of pelvic organs to decrease discomfort and heaviness at end of day in order to participate more fully in activities at home and in the community.  Baseline: will assess in future session Goal status: INITIAL  3.  Patient will demonstrate circumferential and  sequential contraction of >3/5 MMT, > 5 sec hold x5 and 5 consecutive quick flicks with </= 10 min rest between testing bouts, and relaxation of the PFM coordinated with breath for improved management of intra-abdominal pressure and normal bowel and bladder function without the presence of  pain nor incontinence in order to improve participation at home and in the community. Baseline: will assess next visit  Goal status: INITIAL   4.  Patient will report being able to return to activities including, but not limited to: jumping, laughing, vigorous coughing (during a cold) without limitation or incident of stress incontinence to indicate improved IAP management, PFM coordination, and return to prior level of participation at home and in the community. Baseline: leakage with all the above Goal status: INITIAL  5. Patient will demonstrate coordinated lengthening and relaxation of PFM with diaphragmatic inhalation in order to decrease need to use fingers at perineum and allow for unrestricted elimination of urine/feces for improved overall QOL. Baseline: at times has to press at perineum in order to eliminate BM fully Goal status: INITIAL  PLAN: PT Frequency: 1x/week  PT Duration: 12 weeks  Planned Interventions: Therapeutic exercises, Therapeutic activity, Neuromuscular re-education, Balance training, Gait training, Patient/Family education, Self Care, Joint mobilization, Spinal mobilization, Cryotherapy, Moist heat, scar mobilization, Taping, and Manual therapy  Plan For Next Session: phy asses, breath/deep core   Seneca Hoback, PT, DPT  07/22/2022, 4:50 PM

## 2022-07-30 ENCOUNTER — Ambulatory Visit: Payer: 59 | Attending: Obstetrics and Gynecology

## 2022-07-30 DIAGNOSIS — M6281 Muscle weakness (generalized): Secondary | ICD-10-CM | POA: Diagnosis not present

## 2022-07-30 DIAGNOSIS — M6289 Other specified disorders of muscle: Secondary | ICD-10-CM | POA: Insufficient documentation

## 2022-07-30 DIAGNOSIS — R278 Other lack of coordination: Secondary | ICD-10-CM | POA: Diagnosis not present

## 2022-07-30 NOTE — Therapy (Signed)
OUTPATIENT PHYSICAL THERAPY FEMALE PELVIC TREATMENT   Patient Name: Shannon Brady MRN: [id number] DOB:October 18, 1991, 31 y.o., female Today's Date: 07/30/2022   PT End of Session - 07/30/22 1357     Visit Number 2    Number of Visits 12    Date for PT Re-Evaluation 10/14/22    PT Start Time 1400    PT Stop Time 1440    PT Time Calculation (min) 40 min    Activity Tolerance Patient tolerated treatment well             Past Medical History:  Diagnosis Date   Medical history non-contributory    Past Surgical History:  Procedure Laterality Date   no surgical history     TUBAL LIGATION Bilateral 09/23/2016   Procedure: LAPAROSCOPIC BILATERAL TUBAL LIGATION;  Surgeon: Hildred Laser, MD;  Location: [facility];  Service: Gynecology;  Laterality: Bilateral;   Patient Active Problem List   Diagnosis Date Noted   History of COVID-19 04/10/2020   Heavy menses 04/07/2017    PCP: None per Pt  REFERRING PROVIDER: Hildred Laser, MD   REFERRING DIAG:  N81.89 (ICD-10-CM) - Pelvic floor relaxation   THERAPY DIAG:  Pelvic floor dysfunction  Other lack of coordination  Muscle weakness (generalized)  Rationale for Evaluation and Treatment: Rehabilitation  ONSET DATE: 5 years ago   PRECAUTIONS: None  WEIGHT BEARING RESTRICTIONS: No  FALLS:  Has patient fallen in last 6 months? No  OCCUPATION/SOCIAL ACTIVITIES: Staying at home with the kids, home school, physical activity with the kids, squatting  PLOF: Independent   CHIEF CONCERN: Pt reports since having her twin boys, her pelvic floor has not felt the same. Things feel "saggy" and going to have a BM Pt has to use her finger between anus/vaginal opening to feel like everything has emptied. During penetrative sex, Pt has to switch positions in order to be comfortable and feel like "it" is back in the right place. Pt also has urinary leakage when jumping on the trampoline, vigorous coughing (if sick), and laughing.      PATIENT GOALS: Pt would like to not feel the pressure at the vaginal opening and work on her toileting posture, working on stress incontinence     UROLOGICAL HISTORY Pain with urination: No Fully empty bladder: No Stream: inconsistent  Urgency: No Nocturia:  Toileting posture:  Leakage: Urge to void, Coughing, Sneezing, Laughing, Exercise, and Lifting Pads: No   GASTROINTESTINAL HISTORY Pt has no concerns  Pain with bowel movement: No Fully empty rectum: Yes    SEXUAL HISTORY/FUNCTION Ability to have vaginal penetration: Yes; Deep thrusting: Yes Able to achieve orgasm?: Yes  OBSTETRICAL HISTORY Vaginal deliveries: G3P4 Tearing: No Currently pregnant: No  GYNECOLOGICAL HISTORY Hysterectomy: tubal ligation Pelvic Organ Prolapse: Cystocele not graded - "mild cystocele present" per 07/03/22, Hildred Laser, MD  Heaviness/pressure: yes  SUBJECTIVE: Pt has no significant changes from initial evaluation. Pt has been more aware of her posture in general sitting and toileting.   PAIN:  Are you having pain? No   TODAY'S TREATMENT  Neuromuscular Re-education:  Pre-treatment assessment   OBJECTIVE:   DIAGNOSTIC TESTING/IMPRESSIONS:  Per Hildred Laser, MD 07/03/22 -Mild cystocele present    COGNITION: Overall cognitive status: Within functional limits for tasks assessed     POSTURE: 07/22/22  Iliac crest height: L higher  Pelvic obliquity: WNL   SENSATION:  Light touch: , L2-S2 dermatomes  Proprioception:    RANGE OF MOTION:    (Norm range in degrees)  LEFT 07/30/22 RIGHT 07/30/22  Lumbar forward flexion (65):  WNL    Lumbar extension (30): WNL    Lumbar lateral flexion (25):  WNL WNL  Thoracic and Lumbar rotation (30 degrees):    WNL WNL  Hip Flexion (0-125):      Hip IR (0-45):     Hip ER (0-45):     Hip Adduction:      Hip Abduction (0-40):     Hip extension (0-15):     (*= pain, Blank rows = not tested)   STRENGTH: MMT    RLE 07/30/22  LLE 07/30/22  Hip Flexion 5 5  Hip Extension 5 5  Hip Abduction     Hip Adduction     Hip ER     Hip IR  5* 5*  Knee Extension 4 5  Knee Flexion    Dorsiflexion     Plantarflexion (seated) 5 5  (*= pain, Blank rows = not tested)   SPECIAL TESTS:  FABER (SN 81): FADIR (SN 94):     PHYSICAL PERFORMANCE MEASURES: Deferred 2/2 time constraints   STS:  RLE SLS:  LLE SLS:  6 MWT:  :  5TSTS:   PALPATION: Deferred 2/2 time constraints  Abdominal:  Diastasis:  finger above umbilicus,  fingers at and below umbilicus  Scar mobility: present/mobile perpendicular, parallel Rib flare: present/absent  EXTERNAL PELVIC EXAM: Patient educated on the purpose of the pelvic exam and articulated understanding; patient consented to the exam verbally. Deferred 2/2 time constraints  Palpation: Breath coordination: present/absent/inconsistent Voluntary Contraction: present/absent Relaxation: full/delayed/non-relaxing Perineal movement with sustained IAP increase ("bear down"): descent/no change/elevation/excessive descent Perineal movement with rapid IAP increase ("cough"): elevation/no change/descent Pubic symphysis: (0= no contraction, 1= flicker, 2= weak squeeze, 3= fair squeeze with lift, 4= good squeeze and lift against resistance, 5= strong squeeze against strong resistance)   INTERNAL PELVIC EXAM: Patient educated on the purpose of the pelvic exam and articulated understanding; patient consented to the exam verbally. Deferred 2/2 time constraints  Introitus Appears:  Skin integrity:  Scar mobility: Strength (PERF):  Symmetry: Palpation: Prolapse: (0= no contraction, 1= flicker, 2= weak squeeze, 3= fair squeeze with lift, 4= good squeeze and lift against resistance, 5= strong squeeze against strong resistance)   Manual Therapy: Myofascia   Neuromuscular Re-education: Supine   Discussion and demonstration on log roll technique for improved IAP management and to decrease  *** pain, or worsening of prolapse.    Patient response to interventions:    Patient Education:  Patient educated on what to expect during course of physical therapy, POC, and provided with HEP including: toileting posture handout. Patient verbalized understanding and returned demonstration. Patient will benefit from further education in order to maximize compliance and understanding for long-term therapeutic gains.   ASSESSMENT:  Clinical Impression: Patient is a 31 y.o. who was seen today for physical therapy evaluation and treatment for a chief concern of organ prolapse. Today's evaluation suggest deficits in IAP management, PFM coordination, PFM strength, PFM endurance, and posture, as evidenced by a mild cystocele present, increased pressure/heaviness at the end of the day, having to use pushing technique at perineum in order to fully empty bowels, urinary leakage with vigorous coughing/laughing/jumping, and increased L iliac crest height (physical assessment). Patient's responses on FOTO Urinary Problem (75) indicates moderate limitation/disability/distress. Patient's progress may be limited due to time since onset; however, patient's motivation is advantageous. Pt with basic understanding of PFM function in bowel/bladder habits, sexual function, posture, and the deep core.  Patient will benefit from skilled therapeutic intervention to address deficits in IAP management, PFM coordination, PFM strength, PFM endurance, and posture in order to increase PLOF and improve overall QOL.    Objective Impairments: decreased coordination, decreased endurance, decreased strength, improper body mechanics, and postural dysfunction.   Activity Limitations: lifting, bending, squatting, continence, toileting, locomotion level, and caring for others  Personal Factors: Behavior pattern, Past/current experiences, and Time since onset of injury/illness/exacerbation are also affecting patient's functional  outcome.   Rehab Potential: Good  Clinical Decision Making: Evolving/moderate complexity  Evaluation Complexity: Moderate   GOALS: Goals reviewed with patient? Yes  SHORT TERM GOALS: Target date: 09/10/2022  Patient will demonstrate independent and coordinated diaphragmatic breathing in supine with a 1:2 breathing pattern for improved down-regulation of the nervous system and improved management of intra-abdominal pressures in order to increase function at home and in the community. Baseline: will assess next visit  Goal status: INITIAL    LONG TERM GOALS: Target date: 10/22/2022   Patient will score  >/= 78 on FOTO Urinary Problem  in order to demonstrate improved IAP management, improved PFM coordination, and overall improved QOL.  Baseline: 75 Goal status: INITIAL  2. Patient will be able to articulate and demonstrate 3-5 postures that encourage gravity assisted repositioning of pelvic organs to decrease discomfort and heaviness at end of day in order to participate more fully in activities at home and in the community.  Baseline: will assess in future session Goal status: INITIAL  3.  Patient will demonstrate circumferential and sequential contraction of >3/5 MMT, > 5 sec hold x5 and 5 consecutive quick flicks with </= 10 min rest between testing bouts, and relaxation of the PFM coordinated with breath for improved management of intra-abdominal pressure and normal bowel and bladder function without the presence of pain nor incontinence in order to improve participation at home and in the community. Baseline: will assess next visit  Goal status: INITIAL   4.  Patient will report being able to return to activities including, but not limited to: jumping, laughing, vigorous coughing (during a cold) without limitation or incident of stress incontinence to indicate improved IAP management, PFM coordination, and return to prior level of participation at home and in the  community. Baseline: leakage with all the above Goal status: INITIAL  5. Patient will demonstrate coordinated lengthening and relaxation of PFM with diaphragmatic inhalation in order to decrease need to use fingers at perineum and allow for unrestricted elimination of urine/feces for improved overall QOL. Baseline: at times has to press at perineum in order to eliminate BM fully Goal status: INITIAL  PLAN: PT Frequency: 1x/week  PT Duration: 12 weeks  Planned Interventions: Therapeutic exercises, Therapeutic activity, Neuromuscular re-education, Balance training, Gait training, Patient/Family education, Self Care, Joint mobilization, Spinal mobilization, Cryotherapy, Moist heat, scar mobilization, Taping, and Manual therapy  Plan For Next Session: ***   Dmitri Pettigrew, PT, DPT  07/30/2022, 1:58 PM

## 2022-08-05 ENCOUNTER — Ambulatory Visit: Payer: 59

## 2022-08-12 ENCOUNTER — Ambulatory Visit: Payer: 59

## 2022-08-16 ENCOUNTER — Ambulatory Visit: Payer: 59

## 2022-08-19 ENCOUNTER — Ambulatory Visit: Payer: 59

## 2022-08-21 ENCOUNTER — Ambulatory Visit: Payer: 59

## 2022-08-21 DIAGNOSIS — M6281 Muscle weakness (generalized): Secondary | ICD-10-CM

## 2022-08-21 DIAGNOSIS — R278 Other lack of coordination: Secondary | ICD-10-CM

## 2022-08-21 DIAGNOSIS — M6289 Other specified disorders of muscle: Secondary | ICD-10-CM | POA: Diagnosis not present

## 2022-08-21 NOTE — Therapy (Signed)
OUTPATIENT PHYSICAL THERAPY FEMALE PELVIC TREATMENT   Patient Name: Shannon Brady MRN: [id number] DOB:Dec 30, 1990, 31 y.o., female Today's Date: 08/21/2022   PT End of Session - 08/21/22 1014     Visit Number 3    Number of Visits 12    Date for PT Re-Evaluation 10/14/22    Authorization Type IE: 07/22/22    PT Start Time 1015    PT Stop Time 1055    PT Time Calculation (min) 40 min    Activity Tolerance Patient tolerated treatment well             Past Medical History:  Diagnosis Date   Medical history non-contributory    Past Surgical History:  Procedure Laterality Date   no surgical history     TUBAL LIGATION Bilateral 09/23/2016   Procedure: LAPAROSCOPIC BILATERAL TUBAL LIGATION;  Surgeon: Hildred Laser, MD;  Location: [facility];  Service: Gynecology;  Laterality: Bilateral;   Patient Active Problem List   Diagnosis Date Noted   History of COVID-19 04/10/2020   Heavy menses 04/07/2017    PCP: None per Pt  REFERRING PROVIDER: Hildred Laser, MD   REFERRING DIAG:  N81.89 (ICD-10-CM) - Pelvic floor relaxation   THERAPY DIAG:  Pelvic floor dysfunction  Other lack of coordination  Muscle weakness (generalized)  Rationale for Evaluation and Treatment: Rehabilitation  ONSET DATE: 5 years ago   PRECAUTIONS: None  WEIGHT BEARING RESTRICTIONS: No  FALLS:  Has patient fallen in last 6 months? No  OCCUPATION/SOCIAL ACTIVITIES: Staying at home with the kids, home school, physical activity with the kids, squatting  PLOF: Independent   CHIEF CONCERN: Pt reports since having her twin boys, her pelvic floor has not felt the same. Things feel "saggy" and going to have a BM Pt has to use her finger between anus/vaginal opening to feel like everything has emptied. During penetrative sex, Pt has to switch positions in order to be comfortable and feel like "it" is back in the right place. Pt also has urinary leakage when jumping on the trampoline, vigorous coughing  (if sick), and laughing.     PATIENT GOALS: Pt would like to not feel the pressure at the vaginal opening and work on her toileting posture, working on stress incontinence     UROLOGICAL HISTORY Pain with urination: No Fully empty bladder: No Stream: inconsistent  Urgency: No Nocturia:  Toileting posture:  Leakage: Urge to void, Coughing, Sneezing, Laughing, Exercise, and Lifting Pads: No   GASTROINTESTINAL HISTORY Pt has no concerns  Pain with bowel movement: No Fully empty rectum: Yes    SEXUAL HISTORY/FUNCTION Ability to have vaginal penetration: Yes; Deep thrusting: Yes Able to achieve orgasm?: Yes  OBSTETRICAL HISTORY Vaginal deliveries: G3P4 Tearing: No Currently pregnant: No  GYNECOLOGICAL HISTORY Hysterectomy: tubal ligation Pelvic Organ Prolapse: Cystocele not graded - "mild cystocele present" per 07/03/22, Hildred Laser, MD  Heaviness/pressure: yes  SUBJECTIVE: Pt reports feeling a lot better in terms of vaginal pressure/heaviness. Pt is able to sit down more and relax which she feels has helped decrease the pressure.   PAIN:  Are you having pain? No    OBJECTIVE:   DIAGNOSTIC TESTING/IMPRESSIONS:  Per Hildred Laser, MD 07/03/22 -Mild cystocele present    COGNITION: Overall cognitive status: Within functional limits for tasks assessed     POSTURE: 07/22/22  Iliac crest height: L higher  Pelvic obliquity: WNL   RANGE OF MOTION:    (Norm range in degrees)  LEFT 07/30/22 RIGHT 07/30/22  Lumbar forward  flexion (65):  WNL    Lumbar extension (30): WNL    Lumbar lateral flexion (25):  WNL WNL  Thoracic and Lumbar rotation (30 degrees):    WNL WNL  Hip Flexion (0-125):   WNL* WNL  Hip IR (0-45):  WNL WNL  Hip ER (0-45):  WNL WNL  Hip Adduction:      Hip Abduction (0-40):  WNL* WNL  Hip extension (0-15):     (*= pain, Blank rows = not tested)   STRENGTH: MMT    RLE 07/30/22 LLE 07/30/22  Hip Flexion 5 5  Hip Extension 5 5  Hip Abduction      Hip Adduction     Hip ER  5 5  Hip IR  5* 5*  Knee Extension 4 5  Knee Flexion 5 5  Dorsiflexion     Plantarflexion (seated) 5 5  (*= pain, Blank rows = not tested)   SPECIAL TESTS:  FABER (SN 81): negative B FADIR (SN 94): negative B   PALPATION:  Abdominal:  Diastasis: none  Palpation: center of abdomen with discomfort and increased tension, as well as RLQ   EXTERNAL PELVIC EXAM: Patient educated on the purpose of the pelvic exam and articulated understanding; patient consented to the exam verbally.  Breath coordination: present but inconsistent Voluntary Contraction: present, 3/5 MMT with noted Valsalva  Relaxation: full Perineal movement with sustained IAP increase ("bear down"): descent Perineal movement with rapid IAP increase ("cough"): no change (0= no contraction, 1= flicker, 2= weak squeeze, 3= fair squeeze with lift, 4= good squeeze and lift against resistance, 5= strong squeeze against strong resistance)   TODAY'S TREATMENT  Neuromuscular Re-education: Discussion and practice of lifting techniques and proper body mechanics with coordinated breath Squatting technique and semi-lunge stance  VCs and Tcs for proper technique   Supine hooklying diaphragmatic breathing with VCs and TCs for downregulation of the nervous system and improved management of IAP  [facility] abdominal [facility]   Supine hooklying TrA contraction with coordinated exhale   Supine hooklying TrA contraction w/ DRAM compression  Modified plank at wall, x60 secs for improved IAP management and Pt asked about planks affecting prolapse   Review of proper squat technique with coordinated breath. Pt verbalized understanding.    Patient response to interventions: Pt able to feel subtle tightening of TrA   Patient Education:  Patient provided with HEP: lifting techniques/body mechanics, supine TrA activation, modified plank. Patient educated throughout session on appropriate technique and form  using multi-modal cueing, HEP, and activity modification. Patient will benefit from further education in order to maximize compliance and understanding for long-term therapeutic gains.   ASSESSMENT:  Clinical Impression: Patient with excellent motivation to participate in today's session. Pt continues to demonstrate deficits in IAP management, PFM coordination, PFM endurance, LE strength, pain and posture. Session focused mainly on lifting techniques and proper body mechanics with coordinated breath to aid in decreasing pressure felt at vaginal opening. Pt required moderate cueing during lifting techniques to avoid torque on the lower back. Pt required moderate VCs and TCs for beginning of TrA activation for proper technique. Pt able to hold modified plank for 60 secs with cueing for diaphragmatic breathing and proper stance.Pt responded well to active and educational interventions. Patient will continue to benefit from skilled therapeutic intervention to address deficits in IAP management, PFM coordination, PFM endurance, LE strength, pain and posture in order to increase PLOF and improve overall QOL.    Objective Impairments: decreased coordination, decreased endurance, decreased  strength, improper body mechanics, and postural dysfunction.   Activity Limitations: lifting, bending, squatting, continence, toileting, locomotion level, and caring for others  Personal Factors: Behavior pattern, Past/current experiences, and Time since onset of injury/illness/exacerbation are also affecting patient's functional outcome.   Rehab Potential: Good  Clinical Decision Making: Evolving/moderate complexity  Evaluation Complexity: Moderate   GOALS: Goals reviewed with patient? Yes  SHORT TERM GOALS: Target date: 10/02/2022  Patient will demonstrate independent and coordinated diaphragmatic breathing in supine with a 1:2 breathing pattern for improved down-regulation of the nervous system and improved  management of intra-abdominal pressures in order to increase function at home and in the community. Baseline: inconsistent Goal status: INITIAL    LONG TERM GOALS: Target date: 11/13/2022   Patient will score  >/= 78 on FOTO Urinary Problem  in order to demonstrate improved IAP management, improved PFM coordination, and overall improved QOL.  Baseline: 75 Goal status: INITIAL  2. Patient will be able to articulate and demonstrate 3-5 postures that encourage gravity assisted repositioning of pelvic organs to decrease discomfort and heaviness at end of day in order to participate more fully in activities at home and in the community.  Baseline: will assess in future session Goal status: INITIAL  3.  Patient will demonstrate circumferential and sequential contraction of >3/5 MMT, > 5 sec hold x5 and 5 consecutive quick flicks with </= 10 min rest between testing bouts, and relaxation of the PFM coordinated with breath for improved management of intra-abdominal pressure and normal bowel and bladder function without the presence of pain nor incontinence in order to improve participation at home and in the community. Baseline: 3/5 MMT with noted Valsalva Goal status: INITIAL   4.  Patient will report being able to return to activities including, but not limited to: jumping, laughing, vigorous coughing (during a cold) without limitation or incident of stress incontinence to indicate improved IAP management, PFM coordination, and return to prior level of participation at home and in the community. Baseline: leakage with all the above Goal status: INITIAL  5. Patient will demonstrate coordinated lengthening and relaxation of PFM with diaphragmatic inhalation in order to decrease need to use fingers at perineum and allow for unrestricted elimination of urine/feces for improved overall QOL. Baseline: at times has to press at perineum in order to eliminate BM fully Goal status: INITIAL  PLAN: PT  Frequency: 1x/week  PT Duration: 12 weeks  Planned Interventions: Therapeutic exercises, Therapeutic activity, Neuromuscular re-education, Balance training, Gait training, Patient/Family education, Self Care, Joint mobilization, Spinal mobilization, Cryotherapy, Moist heat, scar mobilization, Taping, and Manual therapy  Plan For Next Session:  prolapse position, continue deep core   Rasheda Ledger, PT, DPT  08/21/2022, 10:17 AM

## 2022-08-27 ENCOUNTER — Ambulatory Visit: Payer: 59

## 2022-09-06 ENCOUNTER — Ambulatory Visit: Payer: 59

## 2022-09-12 ENCOUNTER — Ambulatory Visit: Payer: 59

## 2023-06-29 DIAGNOSIS — Z6832 Body mass index (BMI) 32.0-32.9, adult: Secondary | ICD-10-CM | POA: Diagnosis not present

## 2023-06-29 DIAGNOSIS — M2540 Effusion, unspecified joint: Secondary | ICD-10-CM | POA: Diagnosis not present

## 2023-06-29 DIAGNOSIS — M9511 Cauliflower ear, right ear: Secondary | ICD-10-CM | POA: Diagnosis not present

## 2023-09-02 DIAGNOSIS — J069 Acute upper respiratory infection, unspecified: Secondary | ICD-10-CM | POA: Diagnosis not present

## 2023-10-28 NOTE — Progress Notes (Unsigned)
GYNECOLOGY ANNUAL PHYSICAL EXAM PROGRESS NOTE  Subjective:    Shannon Brady is a 32 y.o. [phone] female who presents for an annual exam.  The patient {is/is not/has never been:13135} sexually active. The patient participates in regular exercise: {yes/no/not asked:9010}. Has the patient ever been transfused or tattooed?: {yes/no/not asked:9010}. The patient reports that there {is/is not:9024} domestic violence in her life.   The patient has the following complaints today:   Menstrual History: Menarche age: 37 No LMP recorded.     Gynecologic History:  Contraception: tubal ligation History of STI's: Denies Last Pap: 04/06/2020. Results were: normal.  Denies h/o abnormal pap smears. Last mammogram: Not age appropriate      OB History  Gravida Para Term Preterm AB Living  3 3 2 1  0 4  SAB IAB Ectopic Multiple Live Births  0 0 0 1 4    # Outcome Date GA Lbr Len/2nd Weight Sex Type Anes PTL Lv  3A Preterm 07/11/16 [redacted]w[redacted]d  4 lb 12.5 oz (2.17 kg) M Vag-Spont None  LIV     Name: HELAYNE, METSKER     Apgar1: 8  Apgar5: 9  3B Preterm 07/11/16 [redacted]w[redacted]d  4 lb 14 oz (2.21 kg) M Vag-Spont None  LIV     Name: Carstarphen,BOYB Cinzia     Apgar1: 7  Apgar5: 9  2 Term 03/24/13    F Vag-Spont   LIV  1 Term 09/16/10    F Vag-Spont   LIV    Past Medical History:  Diagnosis Date   Medical history non-contributory     Past Surgical History:  Procedure Laterality Date   no surgical history     TUBAL LIGATION Bilateral 09/23/2016   Procedure: LAPAROSCOPIC BILATERAL TUBAL LIGATION;  Surgeon: Hildred Laser, MD;  Location: [facility];  Service: Gynecology;  Laterality: Bilateral;    Family History  Problem Relation Age of Onset   Healthy Mother    Heart attack Father    Breast cancer Neg Hx    Diabetes Neg Hx    Coronary artery disease Neg Hx     Social History   Socioeconomic History   Marital status: Married    Spouse name: Not on file   Number of children: Not on file    Years of education: Not on file   Highest education level: Not on file  Occupational History   Not on file  Tobacco Use   Smoking status: Never   Smokeless tobacco: Never  Vaping Use   Vaping status: Never Used  Substance and Sexual Activity   Alcohol use: No   Drug use: No   Sexual activity: Yes    Birth control/protection: Surgical    Comment: tubal   Other Topics Concern   Not on file  Social History Narrative   Not on file   Social Determinants of Health   Financial Resource Strain: Not on file  Food Insecurity: Not on file  Transportation Needs: Not on file  Physical Activity: Not on file  Stress: Not on file  Social Connections: Not on file  Intimate Partner Violence: Not on file    Current Outpatient Medications on File Prior to Visit  Medication Sig Dispense Refill   Multiple Vitamins-Calcium (ONE-A-DAY WOMENS PO) Take by mouth.     NON FORMULARY Sprigs Hormonal health (Patient not taking: Reported on 07/22/2022)     OVER THE COUNTER MEDICATION Progesterone cream (Patient not taking: Reported on 07/22/2022)     No current facility-administered  medications on file prior to visit.    Allergies  Allergen Reactions   Penicillins Itching     Review of Systems Constitutional: negative for chills, fatigue, fevers and sweats Eyes: negative for irritation, redness and visual disturbance Ears, nose, mouth, throat, and face: negative for hearing loss, nasal congestion, snoring and tinnitus Respiratory: negative for asthma, cough, sputum Cardiovascular: negative for chest pain, dyspnea, exertional chest pressure/discomfort, irregular heart beat, palpitations and syncope Gastrointestinal: negative for abdominal pain, change in bowel habits, nausea and vomiting Genitourinary: negative for abnormal menstrual periods, genital lesions, sexual problems and vaginal discharge, dysuria and urinary incontinence Integument/breast: negative for breast lump, breast tenderness and  nipple discharge Hematologic/lymphatic: negative for bleeding and easy bruising Musculoskeletal:negative for back pain and muscle weakness Neurological: negative for dizziness, headaches, vertigo and weakness Endocrine: negative for diabetic symptoms including polydipsia, polyuria and skin dryness Allergic/Immunologic: negative for hay fever and urticaria      Objective:  There were no vitals taken for this visit. There is no height or weight on file to calculate BMI.    General Appearance:    Alert, cooperative, no distress, appears stated age  Head:    Normocephalic, without obvious abnormality, atraumatic  Eyes:    PERRL, conjunctiva/corneas clear, EOM's intact, both eyes  Ears:    Normal external ear canals, both ears  Nose:   Nares normal, septum midline, mucosa normal, no drainage or sinus tenderness  Throat:   Lips, mucosa, and tongue normal; teeth and gums normal  Neck:   Supple, symmetrical, trachea midline, no adenopathy; thyroid: no enlargement/tenderness/nodules; no carotid bruit or JVD  Back:     Symmetric, no curvature, ROM normal, no CVA tenderness  Lungs:     Clear to auscultation bilaterally, respirations unlabored  Chest Wall:    No tenderness or deformity   Heart:    Regular rate and rhythm, S1 and S2 normal, no murmur, rub or gallop  Breast Exam:    No tenderness, masses, or nipple abnormality  Abdomen:     Soft, non-tender, bowel sounds active all four quadrants, no masses, no organomegaly.    Genitalia:    Pelvic:external genitalia normal, vagina without lesions, discharge, or tenderness, rectovaginal septum  normal. Cervix normal in appearance, no cervical motion tenderness, no adnexal masses or tenderness.  Uterus normal size, shape, mobile, regular contours, nontender.  Rectal:    Normal external sphincter.  No hemorrhoids appreciated. Internal exam not done.   Extremities:   Extremities normal, atraumatic, no cyanosis or edema  Pulses:   2+ and symmetric all  extremities  Skin:   Skin color, texture, turgor normal, no rashes or lesions  Lymph nodes:   Cervical, supraclavicular, and axillary nodes normal  Neurologic:   CNII-XII intact, normal strength, sensation and reflexes throughout   .  Labs:  Lab Results  Component Value Date   WBC 7.7 07/03/2022   HGB 14.2 07/03/2022   HCT 42.8 07/03/2022   MCV 87 07/03/2022   PLT 356 07/03/2022    Lab Results  Component Value Date   CREATININE 0.65 07/03/2022   BUN 9 07/03/2022   NA 141 07/03/2022   K 4.4 07/03/2022   CL 103 07/03/2022   CO2 24 07/03/2022    Lab Results  Component Value Date   ALT 9 07/03/2022   AST 11 07/03/2022   ALKPHOS 63 07/03/2022   BILITOT 0.4 07/03/2022    Lab Results  Component Value Date   TSH 1.350 04/23/2021  Assessment:   No diagnosis found.   Plan:  Blood tests: {blood tests:13147}. Breast self exam technique reviewed and patient encouraged to perform self-exam monthly. Contraception: {contraceptive methods:5051}. Discussed healthy lifestyle modifications. Mammogram {discussed/ordered:14545} Pap smear {discussed/ordered:14545}. Flu vaccine: Follow up in 1 year for annual exam   Tommie Raymond, CMA [facility]

## 2023-10-29 ENCOUNTER — Ambulatory Visit (INDEPENDENT_AMBULATORY_CARE_PROVIDER_SITE_OTHER): Payer: 59 | Admitting: Obstetrics and Gynecology

## 2023-10-29 ENCOUNTER — Encounter: Payer: Self-pay | Admitting: Obstetrics and Gynecology

## 2023-10-29 ENCOUNTER — Other Ambulatory Visit (HOSPITAL_COMMUNITY)
Admission: RE | Admit: 2023-10-29 | Discharge: 2023-10-29 | Disposition: A | Discharge status: Home / Self Care | Payer: 59 | Source: Ambulatory Visit | Attending: Obstetrics and Gynecology | Admitting: Obstetrics and Gynecology

## 2023-10-29 VITALS — BP 102/66 | HR 68 | Resp 16 | Ht 61.0 in | Wt 159.0 lb

## 2023-10-29 DIAGNOSIS — N930 Postcoital and contact bleeding: Secondary | ICD-10-CM

## 2023-10-29 DIAGNOSIS — Z13220 Encounter for screening for lipoid disorders: Secondary | ICD-10-CM

## 2023-10-29 DIAGNOSIS — N926 Irregular menstruation, unspecified: Secondary | ICD-10-CM

## 2023-10-29 DIAGNOSIS — Z124 Encounter for screening for malignant neoplasm of cervix: Secondary | ICD-10-CM | POA: Insufficient documentation

## 2023-10-29 DIAGNOSIS — Z01419 Encounter for gynecological examination (general) (routine) without abnormal findings: Secondary | ICD-10-CM

## 2023-10-29 DIAGNOSIS — Z1159 Encounter for screening for other viral diseases: Secondary | ICD-10-CM

## 2023-10-30 LAB — COMPREHENSIVE METABOLIC PANEL
ALT: 14 [IU]/L (ref 0–32)
AST: 20 [IU]/L (ref 0–40)
Albumin: 4.5 g/dL (ref 3.9–4.9)
Alkaline Phosphatase: 60 [IU]/L (ref 44–121)
BUN/Creatinine Ratio: 15 (ref 9–23)
BUN: 9 mg/dL (ref 6–20)
Bilirubin Total: 0.8 mg/dL (ref 0.0–1.2)
CO2: 24 mmol/L (ref 20–29)
Calcium: 9.6 mg/dL (ref 8.7–10.2)
Chloride: 103 mmol/L (ref 96–106)
Creatinine, Ser: 0.6 mg/dL (ref 0.57–1.00)
Globulin, Total: 2 g/dL (ref 1.5–4.5)
Glucose: 82 mg/dL (ref 70–99)
Potassium: 4.7 mmol/L (ref 3.5–5.2)
Sodium: 140 mmol/L (ref 134–144)
Total Protein: 6.5 g/dL (ref 6.0–8.5)
eGFR: 121 mL/min/{1.73_m2} (ref >59)

## 2023-10-30 LAB — LIPID PANEL
Chol/HDL Ratio: 3 ratio (ref 0.0–4.4)
Cholesterol, Total: 173 mg/dL (ref 100–199)
HDL: 57 mg/dL (ref >39)
LDL Chol Calc (NIH): 101 mg/dL — ABNORMAL HIGH (ref 0–99)
Triglycerides: 82 mg/dL (ref 0–149)
VLDL Cholesterol Cal: 15 mg/dL (ref 5–40)

## 2023-10-30 LAB — CBC
Hematocrit: 40.5 % (ref 34.0–46.6)
Hemoglobin: 13.1 g/dL (ref 11.1–15.9)
MCH: 28.5 pg (ref 26.6–33.0)
MCHC: 32.3 g/dL (ref 31.5–35.7)
MCV: 88 fL (ref 79–97)
Platelets: 404 10*3/uL (ref 150–450)
RBC: 4.6 x10E6/uL (ref 3.77–5.28)
RDW: 12.8 % (ref 11.7–15.4)
WBC: 6.9 10*3/uL (ref 3.4–10.8)

## 2023-10-30 LAB — TSH: TSH: 1.5 u[IU]/mL (ref 0.450–4.500)

## 2023-11-03 LAB — CYTOLOGY - PAP
Comment: NEGATIVE
Diagnosis: NEGATIVE
High risk HPV: NEGATIVE

## 2024-06-16 ENCOUNTER — Telehealth: Payer: Self-pay

## 2024-06-16 DIAGNOSIS — M549 Dorsalgia, unspecified: Secondary | ICD-10-CM

## 2024-06-16 NOTE — Telephone Encounter (Signed)
 Pt calling triage asking if a referral could be sent to a chiropractor? She does not have a PCP and did some calls but some places do not take Medicaid and the one she found needs a referral. Is this something we can do for her? Please advise.

## 2024-06-17 NOTE — Addendum Note (Signed)
 Addended by: Dalon Reichart on: 06/17/2024 12:06 PM   Modules accepted: Orders

## 2024-06-17 NOTE — Telephone Encounter (Addendum)
 Pain under shoulder blade on her back. Happens every couple of months and has gradually worsen. Place that will take her insurance is [facility] in [place]. Fax # is [phone]. Ordered placed.

## 2024-10-01 ENCOUNTER — Other Ambulatory Visit (HOSPITAL_COMMUNITY)
Admission: RE | Admit: 2024-10-01 | Discharge: 2024-10-01 | Disposition: A | Discharge status: Home / Self Care | Source: Ambulatory Visit | Attending: Registered Nurse | Admitting: Registered Nurse

## 2024-10-01 ENCOUNTER — Ambulatory Visit

## 2024-10-01 VITALS — BP 123/79 | HR 75 | Ht 61.0 in | Wt 168.0 lb

## 2024-10-01 DIAGNOSIS — N898 Other specified noninflammatory disorders of vagina: Secondary | ICD-10-CM | POA: Diagnosis present

## 2024-10-01 MED ORDER — FLUCONAZOLE 150 MG PO TABS: 150.0000 mg | ORAL_TABLET | Freq: Once | ORAL | 0 refills | Status: AC

## 2024-10-01 NOTE — Progress Notes (Signed)
    NURSE VISIT NOTE  Subjective:    Patient ID: Shannon Brady, female    DOB: 11-13-1991, 33 y.o.   MRN: [id number]  HPI  Patient is a 33 y.o. G1P2104 female who presents for discharge and inflamed feeling white discharge with no smell itchiness is really hurting her she has tried coconut oil and over the counter monistat she did use antibiotic and stated she always gets yeast infections with antibiotics will send yeast medication today.  Objective:    BP 123/79   Pulse 75   Ht 5' 1 (1.549 m)   Wt 168 lb (76.2 kg)   BMI 31.74 kg/m    No results found for any visits on 10/01/24.  Assessment:   1. Vaginal irritation       Plan:   GC and chlamydia DNA  probe sent to lab. Treatment: abstain from coitus during course of treatment sent Diflucan   ROV prn if symptoms persist or worsen.   Isay Perleberg H Herminio Kniskern, CMA

## 2024-10-05 LAB — CERVICOVAGINAL ANCILLARY ONLY
Bacterial Vaginitis (gardnerella): NEGATIVE
Candida Glabrata: NEGATIVE
Candida Vaginitis: NEGATIVE
Chlamydia: NEGATIVE
Comment: NEGATIVE
Comment: NEGATIVE
Comment: NEGATIVE
Comment: NEGATIVE
Comment: NEGATIVE
Comment: NORMAL
Neisseria Gonorrhea: NEGATIVE
Trichomonas: NEGATIVE
